# Patient Record
Sex: Male | Born: 1960 | Race: Black or African American | Hispanic: No | Marital: Single | Smoking: Current every day smoker
Health system: Southern US, Community
[De-identification: ages and names within clinical notes are randomized; demographics above are authoritative.]

## PROBLEM LIST (undated history)

## (undated) HISTORY — PX: ORTHOPEDIC SURGERY: SHX850

---

## 2010-06-11 ENCOUNTER — Emergency Department (HOSPITAL_COMMUNITY): Admission: EM | Admit: 2010-06-11 | Discharge: 2010-06-11 | Payer: Self-pay | Admitting: Emergency Medicine

## 2010-11-07 ENCOUNTER — Emergency Department (HOSPITAL_COMMUNITY)
Admission: EM | Admit: 2010-11-07 | Discharge: 2010-11-07 | Payer: Self-pay | Source: Home / Self Care | Admitting: Emergency Medicine

## 2011-01-17 LAB — URINALYSIS, ROUTINE W REFLEX MICROSCOPIC
Bilirubin Urine: NEGATIVE
Glucose, UA: NEGATIVE mg/dL
Ketones, ur: NEGATIVE mg/dL
Nitrite: NEGATIVE
Specific Gravity, Urine: 1.004 — ABNORMAL LOW (ref 1.005–1.030)
Urobilinogen, UA: 0.2 mg/dL (ref 0.0–1.0)

## 2011-01-17 LAB — ETHANOL: Alcohol, Ethyl (B): 248 mg/dL — ABNORMAL HIGH (ref 0–10)

## 2011-01-17 LAB — DIFFERENTIAL
Lymphs Abs: 2.1 10*3/uL (ref 0.7–4.0)
Monocytes Relative: 9 % (ref 3–12)
Neutro Abs: 3.4 10*3/uL (ref 1.7–7.7)

## 2011-01-17 LAB — CBC
HCT: 42.1 % (ref 39.0–52.0)
Hemoglobin: 14.1 g/dL (ref 13.0–17.0)
RBC: 4.93 MIL/uL (ref 4.22–5.81)
RDW: 13.4 % (ref 11.5–15.5)
WBC: 6.3 10*3/uL (ref 4.0–10.5)

## 2011-01-17 LAB — COMPREHENSIVE METABOLIC PANEL
ALT: 21 U/L (ref 0–53)
Albumin: 4.3 g/dL (ref 3.5–5.2)
Alkaline Phosphatase: 55 U/L (ref 39–117)
BUN: 6 mg/dL (ref 6–23)
CO2: 26 mEq/L (ref 19–32)
Calcium: 8.9 mg/dL (ref 8.4–10.5)
Chloride: 106 mEq/L (ref 96–112)
Creatinine, Ser: 0.88 mg/dL (ref 0.4–1.5)
GFR calc Af Amer: >60 mL/min (ref >60)
Total Protein: 7.4 g/dL (ref 6.0–8.3)

## 2011-01-17 LAB — RAPID URINE DRUG SCREEN, HOSP PERFORMED
Amphetamines: NOT DETECTED
Cocaine: NOT DETECTED

## 2013-10-22 ENCOUNTER — Emergency Department (HOSPITAL_COMMUNITY): Payer: Self-pay

## 2013-10-22 ENCOUNTER — Observation Stay (HOSPITAL_COMMUNITY)
Admission: EM | Admit: 2013-10-22 | Discharge: 2013-10-24 | Disposition: A | Discharge status: Home / Self Care | Payer: Self-pay | Attending: Internal Medicine | Admitting: Internal Medicine

## 2013-10-22 ENCOUNTER — Encounter (HOSPITAL_COMMUNITY): Payer: Self-pay | Admitting: Emergency Medicine

## 2013-10-22 DIAGNOSIS — M545 Low back pain, unspecified: Secondary | ICD-10-CM | POA: Diagnosis present

## 2013-10-22 DIAGNOSIS — I309 Acute pericarditis, unspecified: Principal | ICD-10-CM | POA: Diagnosis present

## 2013-10-22 DIAGNOSIS — F17210 Nicotine dependence, cigarettes, uncomplicated: Secondary | ICD-10-CM | POA: Diagnosis present

## 2013-10-22 DIAGNOSIS — R079 Chest pain, unspecified: Secondary | ICD-10-CM | POA: Diagnosis present

## 2013-10-22 DIAGNOSIS — F1010 Alcohol abuse, uncomplicated: Secondary | ICD-10-CM | POA: Diagnosis present

## 2013-10-22 DIAGNOSIS — Z23 Encounter for immunization: Secondary | ICD-10-CM | POA: Insufficient documentation

## 2013-10-22 DIAGNOSIS — F17200 Nicotine dependence, unspecified, uncomplicated: Secondary | ICD-10-CM | POA: Insufficient documentation

## 2013-10-22 LAB — COMPREHENSIVE METABOLIC PANEL
ALT: 15 U/L (ref 0–53)
Alkaline Phosphatase: 72 U/L (ref 39–117)
CO2: 29 mEq/L (ref 19–32)
Calcium: 9.1 mg/dL (ref 8.4–10.5)
GFR calc Af Amer: >90 mL/min (ref >90)
GFR calc non Af Amer: >90 mL/min (ref >90)
Potassium: 3.5 mEq/L (ref 3.5–5.1)
Sodium: 138 mEq/L (ref 135–145)

## 2013-10-22 LAB — URINALYSIS, ROUTINE W REFLEX MICROSCOPIC
Bilirubin Urine: NEGATIVE
Glucose, UA: NEGATIVE mg/dL
Hgb urine dipstick: NEGATIVE
Nitrite: NEGATIVE
Urobilinogen, UA: 1 mg/dL (ref 0.0–1.0)
pH: 7.5 (ref 5.0–8.0)

## 2013-10-22 LAB — CBC WITH DIFFERENTIAL/PLATELET
Basophils Absolute: 0 10*3/uL (ref 0.0–0.1)
HCT: 38.9 % — ABNORMAL LOW (ref 39.0–52.0)
Hemoglobin: 13.5 g/dL (ref 13.0–17.0)
MCV: 82.4 fL (ref 78.0–100.0)
Monocytes Relative: 13 % — ABNORMAL HIGH (ref 3–12)

## 2013-10-22 LAB — PROTIME-INR
INR: 1.03 (ref 0.00–1.49)
Prothrombin Time: 13.3 seconds (ref 11.6–15.2)

## 2013-10-22 LAB — APTT: aPTT: 43 seconds — ABNORMAL HIGH (ref 24–37)

## 2013-10-22 LAB — TROPONIN I: Troponin I: <0.3 ng/mL (ref <0.30)

## 2013-10-22 LAB — D-DIMER, QUANTITATIVE (NOT AT ARMC): D-Dimer, Quant: 0.33 ug/mL-FEU (ref 0.00–0.48)

## 2013-10-22 LAB — LIPASE, BLOOD: Lipase: 18 U/L (ref 11–59)

## 2013-10-22 MED ORDER — PNEUMOCOCCAL VAC POLYVALENT 25 MCG/0.5ML IJ INJ
0.5000 mL | INJECTION | INTRAMUSCULAR | Status: AC
  Administered 2013-10-24: 12:00:00 0.5 mL via INTRAMUSCULAR
  Filled 2013-10-22 (×2): qty 0.5

## 2013-10-22 MED ORDER — KETOROLAC TROMETHAMINE 30 MG/ML IJ SOLN
30.0000 mg | Freq: Once | INTRAMUSCULAR | Status: AC
  Administered 2013-10-22: 30 mg via INTRAVENOUS
  Filled 2013-10-22: qty 1

## 2013-10-22 MED ORDER — ENOXAPARIN SODIUM 40 MG/0.4ML ~~LOC~~ SOLN
40.0000 mg | SUBCUTANEOUS | Status: DC
  Administered 2013-10-22 – 2013-10-23 (×2): 40 mg via SUBCUTANEOUS
  Filled 2013-10-22 (×3): qty 0.4

## 2013-10-22 MED ORDER — LORAZEPAM 2 MG/ML IJ SOLN: 1.0000 mg | Freq: Four times a day (QID) | INTRAMUSCULAR | Status: DC | PRN

## 2013-10-22 MED ORDER — MORPHINE SULFATE 4 MG/ML IJ SOLN
4.0000 mg | Freq: Once | INTRAMUSCULAR | Status: AC
  Administered 2013-10-22: 4 mg via INTRAVENOUS
  Filled 2013-10-22: qty 1

## 2013-10-22 MED ORDER — ASPIRIN 81 MG PO CHEW
324.0000 mg | CHEWABLE_TABLET | Freq: Once | ORAL | Status: AC
  Administered 2013-10-22: 324 mg via ORAL
  Filled 2013-10-22: qty 4

## 2013-10-22 MED ORDER — INFLUENZA VAC SPLIT QUAD 0.5 ML IM SUSP
0.5000 mL | INTRAMUSCULAR | Status: AC
  Administered 2013-10-24: 12:00:00 0.5 mL via INTRAMUSCULAR
  Filled 2013-10-22 (×2): qty 0.5

## 2013-10-22 MED ORDER — GI COCKTAIL ~~LOC~~
30.0000 mL | Freq: Once | ORAL | Status: AC
  Administered 2013-10-22: 30 mL via ORAL
  Filled 2013-10-22: qty 30

## 2013-10-22 MED ORDER — THIAMINE HCL 100 MG/ML IJ SOLN
100.0000 mg | Freq: Every day | INTRAMUSCULAR | Status: DC
  Administered 2013-10-23: 100 mg via INTRAVENOUS
  Filled 2013-10-22 (×2): qty 1

## 2013-10-22 MED ORDER — NITROGLYCERIN IN D5W 200-5 MCG/ML-% IV SOLN
5.0000 ug/min | INTRAVENOUS | Status: DC
  Administered 2013-10-22: 5 ug/min via INTRAVENOUS
  Filled 2013-10-22: qty 250

## 2013-10-22 MED ORDER — SODIUM CHLORIDE 0.9 % IV BOLUS (SEPSIS)
1000.0000 mL | Freq: Once | INTRAVENOUS | Status: AC
  Administered 2013-10-22: 1000 mL via INTRAVENOUS

## 2013-10-22 MED ORDER — FOLIC ACID 1 MG PO TABS
1.0000 mg | ORAL_TABLET | Freq: Every day | ORAL | Status: DC
  Administered 2013-10-22 – 2013-10-24 (×3): 1 mg via ORAL
  Filled 2013-10-22 (×3): qty 1

## 2013-10-22 MED ORDER — HEPARIN BOLUS VIA INFUSION
4000.0000 [IU] | Freq: Once | INTRAVENOUS | Status: AC
  Administered 2013-10-22: 4000 [IU] via INTRAVENOUS
  Filled 2013-10-22: qty 4000

## 2013-10-22 MED ORDER — ONDANSETRON HCL 4 MG/2ML IJ SOLN
4.0000 mg | Freq: Once | INTRAMUSCULAR | Status: AC
  Administered 2013-10-22: 4 mg via INTRAVENOUS
  Filled 2013-10-22: qty 2

## 2013-10-22 MED ORDER — LORAZEPAM 1 MG PO TABS
1.0000 mg | ORAL_TABLET | Freq: Four times a day (QID) | ORAL | Status: DC | PRN
  Administered 2013-10-24: 1 mg via ORAL
  Filled 2013-10-22: qty 1

## 2013-10-22 MED ORDER — ADULT MULTIVITAMIN W/MINERALS CH
1.0000 | ORAL_TABLET | Freq: Every day | ORAL | Status: DC
  Administered 2013-10-22 – 2013-10-24 (×3): 1 via ORAL
  Filled 2013-10-22 (×3): qty 1

## 2013-10-22 MED ORDER — HEPARIN (PORCINE) IN NACL 100-0.45 UNIT/ML-% IJ SOLN
850.0000 [IU]/h | INTRAMUSCULAR | Status: DC
  Administered 2013-10-22: 850 [IU]/h via INTRAVENOUS
  Filled 2013-10-22: qty 250

## 2013-10-22 MED ORDER — VITAMIN B-1 100 MG PO TABS
100.0000 mg | ORAL_TABLET | Freq: Every day | ORAL | Status: DC
  Administered 2013-10-22 – 2013-10-24 (×2): 100 mg via ORAL
  Filled 2013-10-22 (×3): qty 1

## 2013-10-22 MED ORDER — NITROGLYCERIN 0.4 MG SL SUBL
0.4000 mg | SUBLINGUAL_TABLET | SUBLINGUAL | Status: AC | PRN
  Administered 2013-10-22 (×3): 0.4 mg via SUBLINGUAL

## 2013-10-22 MED ORDER — HYDROMORPHONE HCL PF 1 MG/ML IJ SOLN
1.0000 mg | Freq: Once | INTRAMUSCULAR | Status: AC
  Administered 2013-10-22: 1 mg via INTRAVENOUS
  Filled 2013-10-22: qty 1

## 2013-10-22 MED ORDER — KETOROLAC TROMETHAMINE 15 MG/ML IJ SOLN
15.0000 mg | Freq: Three times a day (TID) | INTRAMUSCULAR | Status: DC | PRN
  Filled 2013-10-22: qty 1

## 2013-10-22 NOTE — ED Provider Notes (Signed)
CSN: [id number]     Arrival date & time 10/22/13  1140 History   First MD Initiated Contact with Patient 10/22/13 1154     Chief Complaint  Patient presents with  . Chest Pain   (Consider location/radiation/quality/duration/timing/severity/associated sxs/prior Treatment) Patient is a 52 y.o. male presenting with chest pain.  Chest Pain  Pt with no significant PMH but reports he does smoke cigarettes and drinks alcohol frequently presents with complaints of epigastric and mid chest pain onset around 9am this morning, worsen since that time worse with deep breath but not associated with vomiting, SOB, cough, fever. Pain does not radiate into back. Had similar episode 15 years ago, told it was reflux.   History reviewed. No pertinent past medical history. History reviewed. No pertinent past surgical history. No family history on file. History  Substance Use Topics  . Smoking status: Current Every Day Smoker  . Smokeless tobacco: Not on file  . Alcohol Use: Yes    Review of Systems  Cardiovascular: Positive for chest pain.   All other systems reviewed and are negative except as noted in HPI.   Allergies  Review of patient's allergies indicates no known allergies.  Home Medications   Current Outpatient Rx  Name  Route  Sig  Dispense  Refill  . ibuprofen (ADVIL,MOTRIN) 200 MG tablet   Oral   Take 400 mg by mouth every 6 (six) hours as needed for moderate pain.          BP 168/95  Pulse 81  Temp(Src) 98.2 F (36.8 C)  Resp 24  SpO2 100% Physical Exam  Nursing note and vitals reviewed. Constitutional: He is oriented to person, place, and time. He appears well-developed and well-nourished.  HENT:  Head: Normocephalic and atraumatic.  Eyes: EOM are normal. Pupils are equal, round, and reactive to light.  Neck: Normal range of motion. Neck supple.  Cardiovascular: Normal rate, normal heart sounds and intact distal pulses.   Pulmonary/Chest: Effort normal and breath  sounds normal. He exhibits tenderness (sternum).  Abdominal: Bowel sounds are normal. He exhibits no distension. There is tenderness (epigastric). There is guarding. There is no rebound.  Musculoskeletal: Normal range of motion. He exhibits no edema and no tenderness.  Neurological: He is alert and oriented to person, place, and time. He has normal strength. No cranial nerve deficit or sensory deficit.  Skin: Skin is warm and dry. No rash noted.  Psychiatric: He has a normal mood and affect.    ED Course  Procedures (including critical care time) Labs Review Labs Reviewed  CBC WITH DIFFERENTIAL - Abnormal; Notable for the following:    HCT 38.9 (*)    Monocytes Relative 13 (*)    All other components within normal limits  COMPREHENSIVE METABOLIC PANEL  LIPASE, BLOOD  URINALYSIS, ROUTINE W REFLEX MICROSCOPIC  TROPONIN I  D-DIMER, QUANTITATIVE  HEPARIN LEVEL (UNFRACTIONATED)  ETHANOL  HEPARIN LEVEL (UNFRACTIONATED)  CBC  TROPONIN I  TROPONIN I  TROPONIN I  URINE RAPID DRUG SCREEN (HOSP PERFORMED)   Imaging Review Dg Chest 2 View  10/22/2013   CLINICAL DATA:  One day history of chest pain. , history of tobacco use  EXAM: CHEST  2 VIEW  COMPARISON:  None.  FINDINGS: The lungs are adequately inflated. There are coarse lung markings in the retrocardiac region on the lateral film. These may lie in the medial aspect of the left lower lobe. The cardiopericardial silhouette is normal in size. The pulmonary vascularity is not engorged. The  mediastinum is normal in width. There is mild tortuosity of the descending thoracic aorta. The observed portions of the bony thorax appear normal.  IMPRESSION: Coarse lung markings in the retrocardiac region on the lateral film may reflect atelectasis or pneumonia in the left lower lobe posteriorly. Followup films following therapy are recommended to assure clearing.   Electronically Signed   By: David  [place]   On: 10/22/2013 12:54    EKG Interpretation     Date/Time:  Saturday October 22 2013 11:45:47 EST Ventricular Rate:  78 PR Interval:  154 QRS Duration: 78 QT Interval:  376 QTC Calculation: 428 R Axis:   -16 Text Interpretation:  Normal sinus rhythm Normal ECG No significant change since last tracing Confirmed by SHELDON  MD, CHARLES ([id number]) on 10/22/2013 11:56:02 AM            MDM   1. Chest pain     Pt with no relief in pain from Morphine/Dilaudid, Given NTG with immediate relief but then pain returned, so started on NTG drip. No change in 10/10 pain on drip so then given Toradol with immediate relief. Discussed with [facility] resident who will come evaluate the patient for admission.     Charles B. Bernette Mayers, MD 10/22/13 1745

## 2013-10-22 NOTE — ED Notes (Signed)
Dr. Bernette Mayers made aware that patient states he is now pain free. Dr. Bernette Mayers states to reduce Nitro to at this time.

## 2013-10-22 NOTE — ED Notes (Signed)
Pt. Stated, i started having CP this morning around 0900, it tightens up and releases.

## 2013-10-22 NOTE — ED Notes (Signed)
Dr. Bernette Mayers made aware that Nitro Drip has not decreased pt's pain, states to maintain nitro at at this time.

## 2013-10-22 NOTE — ED Notes (Signed)
Attempted report 

## 2013-10-22 NOTE — ED Notes (Signed)
Dr. Bernette Mayers at bedside with this RN for assessment
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
## 2013-10-22 NOTE — Progress Notes (Signed)
ANTICOAGULATION CONSULT NOTE - Initial Consult  Pharmacy Consult for Heparin Indication: chest pain/ACS  No Known Allergies  Patient Measurements: Height: 5\' 10"  (177.8 cm) Weight: 150 lb (68.04 kg) IBW/kg (Calculated) : 73 Heparin Dosing Weight: 68 kg  Vital Signs: Temp: 98.2 F (36.8 C) (12/20 1145) BP: 114/74 mmHg (12/20 1500) Pulse Rate: 92 (12/20 1500)  Labs:  Recent Labs  10/22/13 1208  HGB 13.5  HCT 38.9*  PLT 232  CREATININE 0.88  TROPONINI <0.30    Estimated Creatinine Clearance: 93.4 ml/min (by C-G formula based on Cr of 0.88).   Medical History: History reviewed. No pertinent past medical history.  Medications:  PTA Med: Ibuprofen prn  Assessment: 52 y.o. male presents with CP. To begin heparin for r/o ACS. CBC stable at baseline.  Goal of Therapy:  Heparin level 0.3-0.7 units/ml Monitor platelets by anticoagulation protocol: Yes   Plan:  1. Heparin IV bolus 4000 units 2. Heparin IV gtt at 850 units/hr 3. Will f/u 6 hr heparin level 4. Daily CBC, heparin level  Christoper Fabian, PharmD, BCPS Clinical pharmacist, pager [phone] 10/22/2013,3:37 PM

## 2013-10-22 NOTE — ED Notes (Signed)
The patient is unable to give urine specimen at this time. The patient has been advised to use call light for assistance. The tech reported to the RN in charge.

## 2013-10-22 NOTE — ED Notes (Signed)
Internal medicine at bedside
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
## 2013-10-23 DIAGNOSIS — I309 Acute pericarditis, unspecified: Secondary | ICD-10-CM | POA: Diagnosis present

## 2013-10-23 DIAGNOSIS — I517 Cardiomegaly: Secondary | ICD-10-CM

## 2013-10-23 LAB — HEMOGLOBIN A1C
Hgb A1c MFr Bld: 5.6 % (ref <5.7)
Mean Plasma Glucose: 114 mg/dL (ref <117)

## 2013-10-23 LAB — LIPID PANEL
Cholesterol: 173 mg/dL (ref 0–200)
HDL: 62 mg/dL (ref >39)
Total CHOL/HDL Ratio: 2.8 RATIO

## 2013-10-23 LAB — SEDIMENTATION RATE: Sed Rate: 9 mm/hr (ref 0–16)

## 2013-10-23 LAB — COMPREHENSIVE METABOLIC PANEL
AST: 19 U/L (ref 0–37)
Albumin: 3.1 g/dL — ABNORMAL LOW (ref 3.5–5.2)
BUN: 12 mg/dL (ref 6–23)
CO2: 26 mEq/L (ref 19–32)
Calcium: 8.5 mg/dL (ref 8.4–10.5)
Creatinine, Ser: 1.04 mg/dL (ref 0.50–1.35)
Sodium: 138 mEq/L (ref 135–145)
Total Bilirubin: 0.9 mg/dL (ref 0.3–1.2)
Total Protein: 6.5 g/dL (ref 6.0–8.3)

## 2013-10-23 LAB — CBC
HCT: 32.1 % — ABNORMAL LOW (ref 39.0–52.0)
MCH: 28.2 pg (ref 26.0–34.0)
MCHC: 34 g/dL (ref 30.0–36.0)
MCV: 83.2 fL (ref 78.0–100.0)
Platelets: 194 10*3/uL (ref 150–400)
RDW: 13.3 % (ref 11.5–15.5)

## 2013-10-23 LAB — TROPONIN I
Troponin I: <0.3 ng/mL (ref <0.30)
Troponin I: <0.3 ng/mL (ref <0.30)

## 2013-10-23 LAB — TSH: TSH: 0.858 u[IU]/mL (ref 0.350–4.500)

## 2013-10-23 LAB — C-REACTIVE PROTEIN: CRP: 3.9 mg/dL — ABNORMAL HIGH (ref <0.60)

## 2013-10-23 MED ORDER — COLCHICINE 0.6 MG PO TABS
0.6000 mg | ORAL_TABLET | Freq: Every day | ORAL | Status: DC
  Administered 2013-10-23 – 2013-10-24 (×2): 0.6 mg via ORAL
  Filled 2013-10-23 (×2): qty 1

## 2013-10-23 MED ORDER — IBUPROFEN 600 MG PO TABS: 600.0000 mg | ORAL_TABLET | Freq: Three times a day (TID) | ORAL | Status: DC

## 2013-10-23 MED ORDER — IBUPROFEN 600 MG PO TABS
600.0000 mg | ORAL_TABLET | Freq: Three times a day (TID) | ORAL | Status: DC
  Administered 2013-10-23 – 2013-10-24 (×5): 600 mg via ORAL
  Filled 2013-10-23 (×6): qty 1

## 2013-10-23 MED ORDER — SENNOSIDES-DOCUSATE SODIUM 8.6-50 MG PO TABS
1.0000 | ORAL_TABLET | Freq: Two times a day (BID) | ORAL | Status: DC
  Administered 2013-10-23 – 2013-10-24 (×2): 1 via ORAL
  Filled 2013-10-23 (×2): qty 1

## 2013-10-23 MED ORDER — NICOTINE 14 MG/24HR TD PT24
14.0000 mg | MEDICATED_PATCH | Freq: Every day | TRANSDERMAL | Status: DC
  Administered 2013-10-23 – 2013-10-24 (×2): 14 mg via TRANSDERMAL
  Filled 2013-10-23 (×2): qty 1

## 2013-10-23 MED ORDER — COLCHICINE 0.6 MG PO TABS: 0.6000 mg | ORAL_TABLET | Freq: Every day | ORAL | Status: DC

## 2013-10-23 MED ORDER — ACETAMINOPHEN 325 MG PO TABS
650.0000 mg | ORAL_TABLET | ORAL | Status: DC | PRN
  Administered 2013-10-23: 650 mg via ORAL
  Filled 2013-10-23: qty 2

## 2013-10-23 NOTE — Progress Notes (Signed)
Received report from [place], RN; assumed care of patient.  Patient arrived from ED via stretcher and is alert and oriented x4.  NAD noted.  Report chest pain 3/10 when he "takes a deep breath".  Vitals obtained and skin assessment completed.  No areas of pressure noted.  Physical assessment completed. (see doc flow sheet).  Faint pericardial rub noted on physical assessment.  ST elevation noted on monitor.  Does not appear to be different from what is presented on EKG.  Spouse at the bedside.  Will continue to monitor

## 2013-10-23 NOTE — Progress Notes (Signed)
  Echocardiogram 2D Echocardiogram has been performed.  Cathie Beams 10/23/2013, 12:31 PM

## 2013-10-23 NOTE — Progress Notes (Signed)
Utilization Review Completed.Coby Antrobus T12/21/2014  

## 2013-10-23 NOTE — Progress Notes (Signed)
Shift Summary:  Patient has had no acute events this shift.  Patient was febrile but managed with meds per order.  Marland Kitchen

## 2013-10-23 NOTE — Progress Notes (Signed)
Subjective: Complaints of chest pain when he breathes deeply and leans forward. No nausea, vomiting, dizziness or SOB. Wife at bedside.  Objective: Vital signs in last 24 hours: Filed Vitals:   10/23/13 0336 10/23/13 0400 10/23/13 0451 10/23/13 0740  BP:  144/73  128/83  Pulse:  72  71  Temp: 99.7 F (37.6 C)   98.8 F (37.1 C)  TempSrc: Oral   Oral  Resp:      Height:      Weight:   143 lb 15.4 oz (65.3 kg)   SpO2:  99%  99%   Weight change:   Intake/Output Summary (Last 24 hours) at 10/23/13 1039 Last data filed at 10/23/13 1610  Gross per 24 hour  Intake   1360 ml  Output    750 ml  Net    610 ml   General appearance: alert, cooperative, appears stated age and no distress Lungs: clear to auscultation bilaterally Heart: regular rate and rhythm, S1, S2 normal, Rubs present heard over anterior chest in cardiac region, heard best on leaning forward, no murmurs or gallops. Abdomen: soft, non-tender; bowel sounds normal; no masses,  no organomegaly Extremities: extremities normal, atraumatic, no cyanosis or edema Pulses: 2+ and symmetric  Lab Results: Basic Metabolic Panel:  Recent Labs Lab 10/22/13 1208 10/23/13 0500  NA 138 138  K 3.5 4.0  CL 98 103  CO2 29 26  GLUCOSE 81 91  BUN 11 12  CREATININE 0.88 1.04  CALCIUM 9.1 8.5   Liver Function Tests:  Recent Labs Lab 10/22/13 1208 10/23/13 0500  AST 26 19  ALT 15 11  ALKPHOS 72 58  BILITOT 0.7 0.9  PROT 8.0 6.5  ALBUMIN 4.2 3.1*    Recent Labs Lab 10/22/13 1208  LIPASE 18   No results found for this basename: AMMONIA,  in the last 168 hours CBC:  Recent Labs Lab 10/22/13 1208 10/23/13 0500  WBC 6.6 6.9  NEUTROABS 4.3  --   HGB 13.5 10.9*  HCT 38.9* 32.1*  MCV 82.4 83.2  PLT 232 194   Cardiac Enzymes:  Recent Labs Lab 10/22/13 1648 10/22/13 2320 10/23/13 0530  TROPONINI <0.30 <0.30 <0.30   BNP: No results found for this basename: PROBNP,  in the last 168  hours D-Dimer:  Recent Labs Lab 10/22/13 1648  DDIMER 0.33   Fasting Lipid Panel:  Recent Labs Lab 10/23/13 0500  CHOL 173  HDL 62  LDLCALC 95  TRIG 80  CHOLHDL 2.8   Coagulation:  Recent Labs Lab 10/22/13 2320  LABPROT 13.3  INR 1.03   Urine Drug Screen: Drugs of Abuse     Component Value Date/Time   LABOPIA NONE DETECTED 06/11/2010 0119   COCAINSCRNUR NONE DETECTED 06/11/2010 0119   LABBENZ NONE DETECTED 06/11/2010 0119   AMPHETMU NONE DETECTED 06/11/2010 0119   THCU NONE DETECTED 06/11/2010 0119   LABBARB  Value: NONE DETECTED        DRUG SCREEN FOR MEDICAL PURPOSES ONLY.  IF CONFIRMATION IS NEEDED FOR ANY PURPOSE, NOTIFY LAB WITHIN 5 DAYS.        LOWEST DETECTABLE LIMITS FOR URINE DRUG SCREEN Drug Class       Cutoff (ng/mL) Amphetamine      1000 Barbiturate      200 Benzodiazepine   200 Tricyclics       300 Opiates          300 Cocaine          300 THC  50 06/11/2010 0119    Alcohol Level:  Recent Labs Lab 10/22/13 1648  ETH <11   Urinalysis:  Recent Labs Lab 10/22/13 1649  COLORURINE YELLOW  LABSPEC 1.020  PHURINE 7.5  GLUCOSEU NEGATIVE  HGBUR NEGATIVE  BILIRUBINUR NEGATIVE  KETONESUR NEGATIVE  PROTEINUR NEGATIVE  UROBILINOGEN 1.0  NITRITE NEGATIVE  LEUKOCYTESUR NEGATIVE    Micro Results: Recent Results (from the past 240 hour(s))  MRSA PCR SCREENING     Status: None   Collection Time    10/22/13  7:53 PM      Result Value Range Status   MRSA by PCR NEGATIVE  NEGATIVE Final   Comment:            The GeneXpert MRSA Assay (FDA     approved for NASAL specimens     only), is one component of a     comprehensive MRSA colonization     surveillance program. It is not     intended to diagnose MRSA     infection nor to guide or     monitor treatment for     MRSA infections.   Studies/Results: Dg Chest 2 View  10/22/2013   CLINICAL DATA:  One day history of chest pain. , history of tobacco use  EXAM: CHEST  2 VIEW  COMPARISON:  None.   FINDINGS: The lungs are adequately inflated. There are coarse lung markings in the retrocardiac region on the lateral film. These may lie in the medial aspect of the left lower lobe. The cardiopericardial silhouette is normal in size. The pulmonary vascularity is not engorged. The mediastinum is normal in width. There is mild tortuosity of the descending thoracic aorta. The observed portions of the bony thorax appear normal.  IMPRESSION: Coarse lung markings in the retrocardiac region on the lateral film may reflect atelectasis or pneumonia in the left lower lobe posteriorly. Followup films following therapy are recommended to assure clearing.   Electronically Signed   By: David  [place]   On: 10/22/2013 12:54   Medications: I have reviewed the patient's current medications. Scheduled Meds: . colchicine  0.6 mg Oral Daily  . enoxaparin (LOVENOX) injection  40 mg Subcutaneous Q24H  . folic acid  1 mg Oral Daily  . ibuprofen  600 mg Oral TID  . influenza vac split quadrivalent PF  0.5 mL Intramuscular Tomorrow-1000  . multivitamin with minerals  1 tablet Oral Daily  . pneumococcal 23 valent vaccine  0.5 mL Intramuscular Tomorrow-1000  . thiamine  100 mg Oral Daily   Or  . thiamine  100 mg Intravenous Daily   Continuous Infusions:  PRN Meds:.LORazepam, LORazepam  Assessment/Plan:  # Acute Pericarditis-  Supported by Chest pain, relieved by sitting up, worse on lying back, examination findings today- +for Rubs, EKG findings this morning mores suggestive of pericarditis- with diffuse ST elev in multiple leads, and totally relievd by Toradol given in the ED. Cardiac ischemia , cardiac tamponade, pneumothorax, PE all ruled out.  - ESR, and a d-dimer, UDS were normal - 2-D echocardiogram- to rule out- Pericardial effusion. - Lipid panel- LDL- 95, Total- 173, TG- 80, HDL- 62, HBA1c-pending. - Start Ibuprofen 600mg  TID, Colchicine- 0.6 daily. - Consider consultation with cardiology - Possible  discharge pending Echo    # Alcohol excessive use: Patient admits to taking his 6 cans of 12 ounces of beer on a daily basis.  - CIWA protocols  - Encourage pt to reduce alcohol intake.  # Cigarette smoking: Nicotine patch.  Dispo: Disposition is deferred at this time, awaiting improvement of current medical problems.  Anticipated discharge in approximately today or tomorrow.  The patient does not have a current PCP (No Pcp Per Patient) and does need an [facility] hospital follow-up appointment after discharge.  The patient does not know have transportation limitations that hinder transportation to clinic appointments.  .Services Needed at time of discharge: Y = Yes, Blank = No PT:   OT:   RN:   Equipment:   Other:     LOS: 1 day   Kennis Carina, MD 10/23/2013, 10:39 AM

## 2013-10-23 NOTE — H&P (Signed)
  Date: 10/23/2013  Patient name: David Morrison  Medical record number: [id number]  Date of birth: 1961/01/18   I have seen and evaluated Selinda Eon and discussed their care with the Residency Team. Mr. Burkett is a 52yo man with limited PMH, cigarette smoking who presented for chest pain which started morning of admission and woke him up.  He described it as sharp and squeezing, non radiating, worse with sitting up and taking a deep breath.  He was treated with nitroglycerin, dilaudid, asa, GI cocktail in the ED with no relief.  He had, however, almost immediate relief with IV toradol.  EKG showed some possible ST elevated in the lateral leads and very mild PR interval depression in I, II and III.  CXR and labs were relatively unremarkable (possible atelectasis in LLL on CXR).    Assessment and Plan: I have seen and evaluated the patient as outlined above. I agree with the formulated Assessment and Plan as detailed in the residents' admission note, with the following changes:   1. Chest pain, likely pericarditis given history: Given h/o smoking and limited medical contact, will rule out for ACS with cardiac enzymes.  TIMI and Well's are 0.  Ddimer is negative.  Check ESR, CRP, TTE.  Agree with PRN IV toradol.  Recheck EKG, if more suggestive of pericarditis, put on scheduled NSAIDs and colchicine.    Other issues per resident note.    Inez Catalina, MD 12/21/201412:13 PM

## 2013-10-23 NOTE — Progress Notes (Signed)
Patient transferred to 5W 26 from 2H 27. Alert and oriented, independent, oriented to room and unit, placed on telemetry box #19, skin intact, no c/o pain. David Morrison

## 2013-10-24 LAB — CBC
Hemoglobin: 11.6 g/dL — ABNORMAL LOW (ref 13.0–17.0)
MCH: 28.1 pg (ref 26.0–34.0)
MCHC: 33.7 g/dL (ref 30.0–36.0)
RDW: 13 % (ref 11.5–15.5)
WBC: 5 10*3/uL (ref 4.0–10.5)

## 2013-10-24 MED ORDER — OMEPRAZOLE 20 MG PO TBEC: 40.0000 mg | DELAYED_RELEASE_TABLET | Freq: Every day | ORAL | Status: DC

## 2013-10-24 MED ORDER — COLCHICINE 0.6 MG PO TABS: 0.6000 mg | ORAL_TABLET | Freq: Every day | ORAL | Status: DC

## 2013-10-24 MED ORDER — IBUPROFEN 600 MG PO TABS: 600.0000 mg | ORAL_TABLET | Freq: Three times a day (TID) | ORAL | Status: DC

## 2013-10-24 NOTE — Progress Notes (Addendum)
Subjective: No complaints today. Feels fine. Ready to go home. No chest pain since yesterday, when NSAIDs were scheduled. No difficulty breathing.  Objective: Vital signs in last 24 hours: Filed Vitals:   10/23/13 1602 10/23/13 1742 10/23/13 2358 10/24/13 0532  BP: 136/89 131/89 130/84 145/85  Pulse:  80 68 64  Temp:  97.7 F (36.5 C) 98.2 F (36.8 C) 97.7 F (36.5 C)  TempSrc:  Oral Oral Oral  Resp:  18 18 16   Height:  5\' 10"  (1.778 m)    Weight:  145 lb 14.4 oz (66.18 kg)  145 lb 14.4 oz (66.18 kg)  SpO2: 100% 98% 100% 100%   Weight change: -4 lb 1.6 oz (-1.86 kg)  Intake/Output Summary (Last 24 hours) at 10/24/13 1304 Last data filed at 10/24/13 1009  Gross per 24 hour  Intake    720 ml  Output      0 ml  Net    720 ml   General appearance: Slim appearing middle aged male, lying in bed comfortably. Lungs: clear to auscultation bilaterally Heart: regular rate and rhythm, S1, S2 normal, Rubs present heard over anterior chest in cardiac region, heard best on leaning forward, no murmurs or gallops. Abdomen: soft, non-tender; bowel sounds normal; no masses,  no organomegaly Extremities: extremities normal, atraumatic, no cyanosis or edema Pulses: No pedal edema. 2+ and symmetric.  Lab Results: Basic Metabolic Panel:  Recent Labs Lab 10/22/13 1208 10/23/13 0500  NA 138 138  K 3.5 4.0  CL 98 103  CO2 29 26  GLUCOSE 81 91  BUN 11 12  CREATININE 0.88 1.04  CALCIUM 9.1 8.5   Liver Function Tests:  Recent Labs Lab 10/22/13 1208 10/23/13 0500  AST 26 19  ALT 15 11  ALKPHOS 72 58  BILITOT 0.7 0.9  PROT 8.0 6.5  ALBUMIN 4.2 3.1*    Recent Labs Lab 10/22/13 1208  LIPASE 18   CBC:  Recent Labs Lab 10/22/13 1208 10/23/13 0500 10/24/13 0615  WBC 6.6 6.9 5.0  NEUTROABS 4.3  --   --   HGB 13.5 10.9* 11.6*  HCT 38.9* 32.1* 34.4*  MCV 82.4 83.2 83.3  PLT 232 194 219   Cardiac Enzymes:  Recent Labs Lab 10/22/13 1648 10/22/13 2320 10/23/13 0530   TROPONINI <0.30 <0.30 <0.30   D-Dimer:  Recent Labs Lab 10/22/13 1648  DDIMER 0.33   Fasting Lipid Panel:  Recent Labs Lab 10/23/13 0500  CHOL 173  HDL 62  LDLCALC 95  TRIG 80  CHOLHDL 2.8   Coagulation:  Recent Labs Lab 10/22/13 2320  LABPROT 13.3  INR 1.03   Urine Drug Screen: Drugs of Abuse     Component Value Date/Time   LABOPIA NONE DETECTED 06/11/2010 0119   COCAINSCRNUR NONE DETECTED 06/11/2010 0119   LABBENZ NONE DETECTED 06/11/2010 0119   AMPHETMU NONE DETECTED 06/11/2010 0119   THCU NONE DETECTED 06/11/2010 0119   LABBARB  Value: NONE DETECTED        DRUG SCREEN FOR MEDICAL PURPOSES ONLY.  IF CONFIRMATION IS NEEDED FOR ANY PURPOSE, NOTIFY LAB WITHIN 5 DAYS.        LOWEST DETECTABLE LIMITS FOR URINE DRUG SCREEN Drug Class       Cutoff (ng/mL) Amphetamine      1000 Barbiturate      200 Benzodiazepine   200 Tricyclics       300 Opiates          300 Cocaine  300 THC              50 06/11/2010 0119    Alcohol Level:  Recent Labs Lab 10/22/13 1648  ETH <11   Urinalysis:  Recent Labs Lab 10/22/13 1649  COLORURINE YELLOW  LABSPEC 1.020  PHURINE 7.5  GLUCOSEU NEGATIVE  HGBUR NEGATIVE  BILIRUBINUR NEGATIVE  KETONESUR NEGATIVE  PROTEINUR NEGATIVE  UROBILINOGEN 1.0  NITRITE NEGATIVE  LEUKOCYTESUR NEGATIVE    Micro Results: Recent Results (from the past 240 hour(s))  MRSA PCR SCREENING     Status: None   Collection Time    10/22/13  7:53 PM      Result Value Range Status   MRSA by PCR NEGATIVE  NEGATIVE Final   Comment:            The GeneXpert MRSA Assay (FDA     approved for NASAL specimens     only), is one component of a     comprehensive MRSA colonization     surveillance program. It is not     intended to diagnose MRSA     infection nor to guide or     monitor treatment for     MRSA infections.   Studies/Results: No results found. Medications: I have reviewed the patient's current medications. Scheduled Meds: . colchicine  0.6 mg  Oral Daily  . enoxaparin (LOVENOX) injection  40 mg Subcutaneous Q24H  . folic acid  1 mg Oral Daily  . ibuprofen  600 mg Oral TID  . multivitamin with minerals  1 tablet Oral Daily  . nicotine  14 mg Transdermal Daily  . senna-docusate  1 tablet Oral BID  . thiamine  100 mg Oral Daily   Continuous Infusions:  PRN Meds:.LORazepam, LORazepam  Assessment/Plan:  # Acute Pericarditis-  Supported by Chest pain, relieved by sitting up, worse on lying back, examination findings today- +for Rubs, EKG findings this morning mores suggestive of pericarditis- with diffuse ST elev in multiple leads, and totally relievd by Toradol given in the ED. Cardiac ischemia , cardiac tamponade, pneumothorax, PE all ruled out.  - ESR, and a d-dimer, UDS were normal - 2-D echocardiogram-  Negative Pericardial effusion. - Lipid panel- LDL- 95, Total- 173, TG- 80, HDL- 62, HBA1c-pending. - Discharge home today on Ibuprofen 600mg  TID for 2 weeks , and Colchicine- 0.6 daily for 3 months. - Encouraged pt about strictly adhering to therapy for the duration of therapy.  - prescribed prophylatic PPI- omeprazole- 40mg  daily for 2 weeks, as pt will be on NSAIDs. - Clinic follow up copuld not be arranged within the next 2 weeks, but pt has an appointment to follow up with the community [facility] and wellness at 11am on the 7th of January.  # Alcohol excessive use: Patient admits to taking his 6 cans of 12 ounces of beer on a daily basis.  - CIWA protocols. - Encourage pt to reduce alcohol intake.  # Cigarette smoking: Nicotine patch.   Dispo: Disposition is deferred at this time, awaiting improvement of current medical problems.  Anticipated discharge in approximately today or tomorrow.  The patient does not have a current PCP (No Pcp Per Patient) and does need an [facility] hospital follow-up appointment after discharge.  The patient does not know have transportation limitations that hinder transportation to clinic  appointments.  .Services Needed at time of discharge: Y = Yes, Blank = No PT:   OT:   RN:   Equipment:   Other:  LOS: 2 days   Kennis Carina, MD 10/24/2013, 1:04 PM

## 2013-10-24 NOTE — H&P (Deleted)
Name: David Morrison MRN: [id number] DOB: Mar 11, 1961 52 y.o. PCP: No Pcp Per Patient  Date of Admission: 10/22/2013 11:49 AM Date of Discharge: 10/24/2013 Attending Physician: Inez Catalina, MD  Discharge Diagnosis: Principal Problem:   Acute pericarditis Active Problems:   Smoking 1/2 pack a day or less   Excessive drinking alcohol   Low back pain  Discharge Medications:   Medication List         colchicine 0.6 MG tablet  Take 1 tablet (0.6 mg total) by mouth daily.     ibuprofen 600 MG tablet  Commonly known as:  ADVIL,MOTRIN  Take 1 tablet (600 mg total) by mouth 3 (three) times daily.     Omeprazole 20 MG Tbec  Take 2 tablets (40 mg total) by mouth daily.        Disposition and follow-up:   David Morrison was discharged from [facility] in Good condition.  At the hospital follow up visit please address:  1.  Resolution of chest pain. Compliance with Ibuprofen 600mg  TID for 2 weeks, Colchicine- 0.6 daily for 3 months and prophylactic therapy with Omeprazole 40mg  daily for 2 weeks.    Follow-up Appointments:     Follow-up Information   Follow up with [facility] On 11/09/2013. (11 am for hospital follow up)    Contact information:   [address] [place] [address] [phone]      Follow up with [facility] On 11/15/2013. (12 for orange card eligibility, please bring completed paper work)    Contact information:   [address] [place] [address] [phone]      Discharge Instructions: Discharge Orders   Future Appointments Provider Department Dept Phone   11/09/2013 11:00 AM Gaylord Shih, MD [facility] [phone]   11/15/2013 12:00 PM [employer] [facility] [phone]   Future Orders Complete By Expires   Diet - low sodium heart healthy  As directed    Diet - low sodium heart healthy  As directed    Discharge instructions  As directed    Comments:     You have a condition called pericarditis, which is an inflammation of the lining of your heart. You will be taking Ibuprofen 600mg  three times a day for 2 weeks. And also we will also be giving you colchicine- 0.6 mg once a day which you must take for 3 months.  We will schedule an appoitment for you to see [name] in clinic just to be sure everything is okay. This appointment can not be scheduled now as it is weekend, but we will contact you with the information.   Please this is very important. Please ensure that you get these drugs as it is very important for your heart, complications can develop if this is not treated appropriately.   Discharge instructions  As directed    Comments:     We will be discharging you to complete 2 weeks of Ibuprofen, please take 600mg  three times a day. Also Colchicine- 0.6mg  once a day for 3 months.  Ibuprofen can cause stomach ulcers and irritate the lining of your stomach. So we will be prescribing an additional medication- called Omeprazole or prilosec which is over the counter. Please take 40mg  once a day for two weeks.   We were not able to schedule an appointment for you to see [name] in clinic  within the next 2 weeks, but you have an appointment for the January the 7th at 11am, with the [employer] and wellness. Please keep this appointment.   Increase activity slowly  As directed    Increase activity slowly  As directed       Consultations:    Procedures Performed:  Dg Chest 2 View  10/22/2013   CLINICAL DATA:  One day history of chest pain. , history of tobacco use  EXAM: CHEST  2 VIEW  COMPARISON:  None.  FINDINGS: The lungs are adequately inflated. There are coarse lung markings in the retrocardiac region on the lateral film. These may lie in the medial aspect of the left lower lobe. The cardiopericardial silhouette is normal in size. The pulmonary  vascularity is not engorged. The mediastinum is normal in width. There is mild tortuosity of the descending thoracic aorta. The observed portions of the bony thorax appear normal.  IMPRESSION: Coarse lung markings in the retrocardiac region on the lateral film may reflect atelectasis or pneumonia in the left lower lobe posteriorly. Followup films following therapy are recommended to assure clearing.   Electronically Signed   By: David  [place]   On: 10/22/2013 12:54    2D Echo: TEE- LV EF: 50% - 55% Pericardium: There was no pericardial effusion. Study Conclusions- Left ventricle: The cavity size was normal. There was moderate concentric hypertrophy. Systolic function was normal. The estimated ejection fraction was in the range of 50% to 55%. Wall motion was normal; there were no regional wall motion abnormalities.  Admission HPI: Chief Complaint: Chest pain  History of Present Illness: This is a 52 year old African American gentleman, with past medical history of cigarette smoking and alcohol use, who presents to the ED, with chest pain, which started in the morning of admission. He reports that when he woke up today in the morning, he noticed that his chest was hurting. He describes his chest pain as sharp and squeezing, reaching an intensity of 8/10 but had significantly improved after IV Toradol 30 mg. Initially, he ignored it and proceeded to his workplace. However, when the chest pain persisted, he drove himself to the emergency department. The chest pain is intermittent nonradiating, but it increased with breathing deeply. He denies diaphoresis, nausea, vomiting or dizziness. He also denies SOB. His chest pain is worsened by lying flat and it is somehow improved by sitting upright. However, he doesn't improve with leaning forward. He denies cough, fevers, chills, reduced appetite, wheezing. He also denies orthopnea, exertional dyspnea, or chest pain with exertion. Patient is active, and this is the  first time he has ever experienced chest pain. He has no previous cardiac evaluation. He denies any illicit drug use. Patient has not been recently on a long trip. No history of blood clots. He denies palpitations, no lower extremity edema.  His chest pain in the emergency department, was not relieved by Dilaudid 1 mg IV, aspirin, GI cocktail, or morphine 4 mg. Also sublingual nitroglycerin 0.4 mg x3, only slightly reduced his chest pain. However, 30 mg of Toradol IV significantly improved his chest pain   Hospital Course by problem list: Principal Problem:   Acute pericarditis Active Problems:   Smoking 1/2 pack a day or less   Excessive drinking alcohol   Low back pain  # Acute Pericarditis-  Pt presented with complaints of chest pain, which were pleuritic in nature, Clinical history seemed to point toward acute pericarditis as a possible cause of his chest  pain , especially given that his chest pain improved by sitting upright and was significantly relieved with Toradol and not with Dialudid, Nitroglycerin (both sublingual and IV drip), or morphine which were given in the ED,but initial EKG was not supportive, with up sloping St segment in lead 3 only.  Other etiologies that were considered, Cardiac ischemia which was ruled out, with 3 Negative troponins and EKG findings that were not supportive. Pneumothorax, aortic dissection and Pneumonia were excluded with normal vital signs and chest x-ray that was not suggestive. Low clinical index of suspicion, with no known risk factors for PE, but a Ddimer was done which was negative. Patient was admitted to Telemetry. ESR- WNL, CRP was elevated at 3.9, UDS- was negative, lipid panel- LDL- 95, TSH- WNL, HBA1c- 5.6. Pt was placed on IV toradol- Q8H.  EKG was repeated the next morning showed diffuse ST segment elevation in multiple leads, which was very suggestive of acute pericarditis, and on examination pericardial rubs were heard on chest auscultation  when  patient leaned forward. 2D echo ordered was negative for Pericardial effusion. ANA and Beta quantiferon were drawn to rule out Autoimmune dx and TB pericarditis as possible etiologies but these were both negative. Exact cause of pericarditis was therefore not known. Patient reported absence of chest pain on toradol, was therefore discharged to complete a 2 week course of Ibuprofen at 600mg  TID and Colchicine 0.6mg  daily for 3 months. And was counselled on the importance of adhering to treatment,and to follow up closely in clinic.   # Alcohol excessive use: Patient admitted to drinking 6 cans of 12 ounces of beer on a daily basis. Pt was placed on CIWA protocol on admission, but no symptoms of alcohol withdrawal developed during admission.  # Cigarette smoking: Nicotine patch.    Discharge Vitals:   BP 131/85  Pulse 78  Temp(Src) 98.5 F (36.9 C) (Oral)  Resp 17  Ht 5\' 10"  (1.778 m)  Wt 145 lb 14.4 oz (66.18 kg)  BMI 20.93 kg/m2  SpO2 100%  Discharge Labs:  Results for orders placed during the hospital encounter of 10/22/13 (from the past 24 hour(s))  CBC     Status: Abnormal   Collection Time    10/24/13  6:15 AM      Result Value Range   WBC 5.0  4.0 - 10.5 K/uL   RBC 4.13 (*) 4.22 - 5.81 MIL/uL   Hemoglobin 11.6 (*) 13.0 - 17.0 g/dL   HCT 16.1 (*) 09.6 - 04.5 %   MCV 83.3  78.0 - 100.0 fL   MCH 28.1  26.0 - 34.0 pg   MCHC 33.7  30.0 - 36.0 g/dL   RDW 40.9  81.1 - 91.4 %   Platelets 219  150 - 400 K/uL    Signed: Kennis Carina, MD 10/24/2013, 2:45 PM   Time Spent on Discharge: 35 minutes Services Ordered on Discharge: None. Equipment Ordered on Discharge: None.

## 2013-10-24 NOTE — Progress Notes (Signed)
Internal Medicine Attending  Date: 10/24/2013  Patient name: David Morrison Medical record number: [id number] Date of birth: 1961/07/13 Age: 52 y.o. Gender: male  I saw and evaluated the patient and discussed his care on a.m. rounds with house staff. I reviewed the resident's note by Dr. Mariea Clonts and I agree with the resident's findings and plans as documented in her note.  Patient was hospitalized with symptoms and EKG consistent with acute pericarditis; his pain resolved yesterday on ibuprofen and has not recurred.  He is feeling well today, without any problems.  The cause of his pericarditis is not clear; he has no symptoms to suggest an underlying systemic disease.  Agree with plan to discharge home today on ibuprofen and low dose colchicine.  An ANA and Quantiferon TB assay are pending, and should be reviewed when he returns for outpatient followup within the next 2 weeks.

## 2013-10-24 NOTE — Care Management Note (Signed)
    Page 1 of 1   10/24/2013     4:27:00 PM   CARE MANAGEMENT NOTE 10/24/2013  Patient:  David Morrison, David Morrison   Account Number:  [id number]  Date Initiated:  10/24/2013  Documentation initiated by:  Letha Cape  Subjective/Objective Assessment:   dx acute percarditis  admit- lives with spouse.     Action/Plan:   Anticipated DC Date:  10/24/2013   Anticipated DC Plan:  HOME/SELF CARE      DC Planning Services  CM consult  [facility]-up appt scheduled  [facility]      Choice offered to / List presented to:             Status of service:  Completed, signed off Medicare Important Message given?   (If response is "NO", the following Medicare IM given date fields will be blank) Date Medicare IM given:   Date Additional Medicare IM given:    Discharge Disposition:  HOME/SELF CARE  Per UR Regulation:  Reviewed for med. necessity/level of care/duration of stay  If discussed at Long Length of Stay Meetings, dates discussed:    Comments:  10/24/13 16:25 Letha Cape RN, BSN [phone] patient dc to home, no NCM referral, no needs anticipated, RN stated patient did not need any medication ast and was dc home.

## 2013-10-25 LAB — ANA: Anti Nuclear Antibody(ANA): NEGATIVE

## 2013-10-26 LAB — QUANTIFERON TB GOLD ASSAY (BLOOD)
Interferon Gamma Release Assay: NEGATIVE
Mitogen value: 8.26 IU/mL
Quantiferon Nil Value: 0.03 IU/mL

## 2013-10-27 NOTE — Discharge Summary (Signed)
Name: David Morrison MRN: [id number] DOB: 05-16-1961 52 y.o. PCP: No Pcp Per Patient  Date of Admission: 10/22/2013 11:49 AM Date of Discharge: 10/24/2013 Attending Physician: Inez Catalina, MD  Discharge Diagnosis: Principal Problem:   Acute pericarditis Active Problems:   Smoking 1/2 pack a day or less   Excessive drinking alcohol   Low back pain  Discharge Medications:   Medication List         colchicine 0.6 MG tablet  Take 1 tablet (0.6 mg total) by mouth daily.     ibuprofen 600 MG tablet  Commonly known as:  ADVIL,MOTRIN  Take 1 tablet (600 mg total) by mouth 3 (three) times daily.     Omeprazole 20 MG Tbec  Take 2 tablets (40 mg total) by mouth daily.        Disposition and follow-up:   Mr.David Morrison was discharged from [facility] in Good condition.  At the hospital follow up visit please address:  1.  Resolution of chest pain. Compliance with Ibuprofen 600mg  TID for 2 weeks, Colchicine- 0.6 daily for 3 months and prophylactic therapy with Omeprazole 40mg  daily for 2 weeks.    Follow-up Appointments:     Follow-up Information   Follow up with [facility] On 11/09/2013. (11 am for hospital follow up)    Contact information:   [address] [place] [address] [phone]      Follow up with [facility] On 11/15/2013. (12 for orange card eligibility, please bring completed paper work)    Contact information:   [address] [place] [address] [phone]      Discharge Instructions: Discharge Orders   Future Appointments Provider Department Dept Phone   11/09/2013 11:00 AM Gaylord Shih, MD [facility] [phone]   11/15/2013 12:00 PM [employer] Financial Counselor [facility] [phone]   Future Orders Complete By Expires   Diet - low sodium heart healthy  As directed    Diet - low sodium heart healthy  As directed    Discharge instructions  As directed    Comments:     You have a condition called pericarditis, which is an inflammation of the lining of your heart. You will be taking Ibuprofen 600mg  three times a day for 2 weeks. And also we will also be giving you colchicine- 0.6 mg once a day which you must take for 3 months.  We will schedule an appoitment for you to see David Morrison in clinic just to be sure everything is okay. This appointment can not be scheduled now as it is weekend, but we will contact you with the information.   Please this is very important. Please ensure that you get these drugs as it is very important for your heart, complications can develop if this is not treated appropriately.   Discharge instructions  As directed    Comments:     We will be discharging you to complete 2 weeks of Ibuprofen, please take 600mg  three times a day. Also Colchicine- 0.6mg  once a day for 3 months.  Ibuprofen can cause stomach ulcers and irritate the lining of your stomach. So we will be prescribing an additional medication- called Omeprazole or prilosec which is over the counter. Please take 40mg  once a day for two weeks.   We were not able to schedule an appointment for you to see David Morrison in clinic  within the next 2 weeks, but you have an appointment for the January the 7th at 11am, with the [employer] and [facility]. Please keep this appointment.   Increase activity slowly  As directed    Increase activity slowly  As directed       Consultations:  None  Procedures Performed:  Dg Chest 2 View  10/22/2013   CLINICAL DATA:  One day history of chest pain. , history of tobacco use  EXAM: CHEST  2 VIEW  COMPARISON:  None.  FINDINGS: The lungs are adequately inflated. There are coarse lung markings in the retrocardiac region on the lateral film. These may lie in the medial aspect of the left lower lobe. The cardiopericardial silhouette is normal in size. The  pulmonary vascularity is not engorged. The mediastinum is normal in width. There is mild tortuosity of the descending thoracic aorta. The observed portions of the bony thorax appear normal.  IMPRESSION: Coarse lung markings in the retrocardiac region on the lateral film may reflect atelectasis or pneumonia in the left lower lobe posteriorly. Followup films following therapy are recommended to assure clearing.   Electronically Signed   By: David  [place]   On: 10/22/2013 12:54    2D Echo: TEE- LV EF: 50% - 55% Pericardium: There was no pericardial effusion. Study Conclusions- Left ventricle: The cavity size was normal. There was moderate concentric hypertrophy. Systolic function was normal. The estimated ejection fraction was in the range of 50% to 55%. Wall motion was normal; there were no regional wall motion abnormalities.  Admission HPI: Chief Complaint: Chest pain   History of Present Illness: This is a 52 year old African American gentleman, with past medical history of cigarette smoking and alcohol use, who presents to the ED, with chest pain, which started in the morning of admission. He reports that when he woke up today in the morning, he noticed that his chest was hurting. He describes his chest pain as sharp and squeezing, reaching an intensity of 8/10 but had significantly improved after IV Toradol 30 mg. Initially, he ignored it and proceeded to his workplace. However, when the chest pain persisted, he drove himself to the emergency department. The chest pain is intermittent nonradiating, but it increased with breathing deeply. He denies diaphoresis, nausea, vomiting or dizziness. He also denies SOB. His chest pain is worsened by lying flat and it is somehow improved by sitting upright. However, he doesn't improve with leaning forward. He denies cough, fevers, chills, reduced appetite, wheezing. He also denies orthopnea, exertional dyspnea, or chest pain with exertion. Patient is active, and  this is the first time he has ever experienced chest pain. He has no previous cardiac evaluation. He denies any illicit drug use. Patient has not been recently on a long trip. No history of blood clots. He denies palpitations, no lower extremity edema.  His chest pain in the emergency department, was not relieved by Dilaudid 1 mg IV, aspirin, GI cocktail, or morphine 4 mg. Also sublingual nitroglycerin 0.4 mg x3, only slightly reduced his chest pain. However, 30 mg of Toradol IV significantly improved his chest pain   Hospital Course by problem list: Principal Problem:   Acute pericarditis Active Problems:   Smoking 1/2 pack a day or less   Excessive drinking alcohol   Low back pain  # Acute Pericarditis-  Pt presented with complaints of chest pain, which were pleuritic in nature, Clinical history seemed to point toward acute pericarditis as a possible cause of his  chest pain , especially given that his chest pain improved by sitting upright and was significantly relieved with Toradol and not with Dialudid, Nitroglycerin (both sublingual and IV drip), or morphine which were given in the ED,but initial EKG was not supportive, with up sloping St segment in lead 3 only.  Other etiologies that were considered, Cardiac ischemia which was ruled out, with 3 Negative troponins and EKG findings that were not supportive. Pneumothorax, aortic dissection and Pneumonia were excluded with normal vital signs and chest x-ray that was not suggestive. Low clinical index of suspicion, with no known risk factors for PE, but a Ddimer was done which was negative. Patient was admitted to Telemetry. ESR- WNL, CRP was elevated at 3.9, UDS- was negative, lipid panel- LDL- 95, TSH- WNL, HBA1c- 5.6. Pt was placed on IV toradol- Q8H.  EKG was repeated the next morning showed diffuse ST segment elevation in multiple leads, which was very suggestive of acute pericarditis, and on examination pericardial rubs were heard on chest  auscultation  when patient leaned forward. 2D echo ordered was negative for Pericardial effusion. ANA and Beta quantiferon were drawn to rule out Autoimmune dx and TB pericarditis as possible etiologies but these were both negative. Exact cause of pericarditis was therefore not known. Patient reported absence of chest pain on toradol, was therefore discharged to complete a 2 week course of Ibuprofen at 600mg  TID and Colchicine 0.6mg  daily for 3 months. And was counselled on the importance of adhering to treatment,and to follow up closely in clinic.   # Alcohol excessive use: Patient admitted to drinking 6 cans of 12 ounces of beer on a daily basis. Pt was placed on CIWA protocol on admission, but no symptoms of alcohol withdrawal developed during admission.  # Cigarette smoking: Nicotine patch.    Discharge Vitals:   BP 131/85  Pulse 78  Temp(Src) 98.5 F (36.9 C) (Oral)  Resp 17  Ht 5\' 10"  (1.778 m)  Wt 145 lb 14.4 oz (66.18 kg)  BMI 20.93 kg/m2  SpO2 100%  Discharge Labs:  Results for orders placed during the hospital encounter of 10/22/13 (from the past 24 hour(s))  CBC     Status: Abnormal   Collection Time    10/24/13  6:15 AM      Result Value Range   WBC 5.0  4.0 - 10.5 K/uL   RBC 4.13 (*) 4.22 - 5.81 MIL/uL   Hemoglobin 11.6 (*) 13.0 - 17.0 g/dL   HCT 16.1 (*) 09.6 - 04.5 %   MCV 83.3  78.0 - 100.0 fL   MCH 28.1  26.0 - 34.0 pg   MCHC 33.7  30.0 - 36.0 g/dL   RDW 40.9  81.1 - 91.4 %   Platelets 219  150 - 400 K/uL    Signed: Kennis Carina, MD 10/24/2013, 2:45 PM   Time Spent on Discharge: 35 minutes Services Ordered on Discharge: None. Equipment Ordered on Discharge: None.

## 2013-11-09 ENCOUNTER — Inpatient Hospital Stay: Payer: Self-pay | Admitting: Cardiology

## 2013-11-15 ENCOUNTER — Ambulatory Visit: Payer: Self-pay

## 2015-01-07 ENCOUNTER — Emergency Department (HOSPITAL_COMMUNITY)
Admission: EM | Admit: 2015-01-07 | Discharge: 2015-01-07 | Disposition: A | Discharge status: Home / Self Care | Payer: No Typology Code available for payment source | Attending: Emergency Medicine | Admitting: Emergency Medicine

## 2015-01-07 ENCOUNTER — Encounter (HOSPITAL_COMMUNITY): Payer: Self-pay | Admitting: *Deleted

## 2015-01-07 DIAGNOSIS — H9202 Otalgia, left ear: Secondary | ICD-10-CM | POA: Diagnosis present

## 2015-01-07 DIAGNOSIS — Z720 Tobacco use: Secondary | ICD-10-CM | POA: Insufficient documentation

## 2015-01-07 DIAGNOSIS — H6092 Unspecified otitis externa, left ear: Secondary | ICD-10-CM | POA: Diagnosis not present

## 2015-01-07 DIAGNOSIS — Z79899 Other long term (current) drug therapy: Secondary | ICD-10-CM | POA: Insufficient documentation

## 2015-01-07 DIAGNOSIS — Z791 Long term (current) use of non-steroidal anti-inflammatories (NSAID): Secondary | ICD-10-CM | POA: Diagnosis not present

## 2015-01-07 MED ORDER — TRAMADOL HCL 50 MG PO TABS: 50.0000 mg | ORAL_TABLET | Freq: Four times a day (QID) | ORAL | Status: DC | PRN

## 2015-01-07 MED ORDER — CIPROFLOXACIN-DEXAMETHASONE 0.3-0.1 % OT SUSP: 4.0000 [drp] | Freq: Two times a day (BID) | OTIC | Status: DC

## 2015-01-07 NOTE — ED Notes (Signed)
C/o bi-lateral ear pain for 3 days/  Lt worse than the rt
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
## 2015-01-08 ENCOUNTER — Encounter (HOSPITAL_COMMUNITY): Payer: Self-pay | Admitting: Emergency Medicine

## 2015-01-08 ENCOUNTER — Emergency Department (INDEPENDENT_AMBULATORY_CARE_PROVIDER_SITE_OTHER)
Admission: EM | Admit: 2015-01-08 | Discharge: 2015-01-08 | Disposition: A | Discharge status: Home / Self Care | Payer: 59 | Source: Home / Self Care | Attending: Family Medicine | Admitting: Family Medicine

## 2015-01-08 DIAGNOSIS — H6092 Unspecified otitis externa, left ear: Secondary | ICD-10-CM | POA: Diagnosis not present

## 2015-01-08 MED ORDER — NEOMYCIN-POLYMYXIN-HC 3.5-10000-1 OT SUSP: 4.0000 [drp] | Freq: Three times a day (TID) | OTIC | Status: DC

## 2015-01-08 MED ORDER — CEPHALEXIN 500 MG PO CAPS: 500.0000 mg | ORAL_CAPSULE | Freq: Four times a day (QID) | ORAL | Status: DC

## 2015-01-08 NOTE — Discharge Instructions (Signed)
Warm soak to ear before using ear drops, take all of medicine, see ear doctor if further problems.

## 2015-01-08 NOTE — ED Notes (Signed)
Bilateral ear pain, left worse than right ear.  Denies cough, cold or congestion
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
## 2015-04-08 ENCOUNTER — Encounter (HOSPITAL_COMMUNITY): Payer: Self-pay | Admitting: *Deleted

## 2015-04-08 ENCOUNTER — Emergency Department (HOSPITAL_COMMUNITY): Payer: No Typology Code available for payment source

## 2015-04-08 ENCOUNTER — Emergency Department (HOSPITAL_COMMUNITY)
Admission: EM | Admit: 2015-04-08 | Discharge: 2015-04-08 | Disposition: A | Discharge status: Home / Self Care | Payer: No Typology Code available for payment source | Attending: Emergency Medicine | Admitting: Emergency Medicine

## 2015-04-08 DIAGNOSIS — J159 Unspecified bacterial pneumonia: Secondary | ICD-10-CM | POA: Diagnosis not present

## 2015-04-08 DIAGNOSIS — Z792 Long term (current) use of antibiotics: Secondary | ICD-10-CM | POA: Insufficient documentation

## 2015-04-08 DIAGNOSIS — Z791 Long term (current) use of non-steroidal anti-inflammatories (NSAID): Secondary | ICD-10-CM | POA: Diagnosis not present

## 2015-04-08 DIAGNOSIS — Z720 Tobacco use: Secondary | ICD-10-CM | POA: Diagnosis not present

## 2015-04-08 DIAGNOSIS — R05 Cough: Secondary | ICD-10-CM | POA: Diagnosis present

## 2015-04-08 DIAGNOSIS — J189 Pneumonia, unspecified organism: Secondary | ICD-10-CM

## 2015-04-08 DIAGNOSIS — Z79899 Other long term (current) drug therapy: Secondary | ICD-10-CM | POA: Diagnosis not present

## 2015-04-08 MED ORDER — IBUPROFEN 800 MG PO TABS
800.0000 mg | ORAL_TABLET | Freq: Once | ORAL | Status: AC
  Administered 2015-04-08: 800 mg via ORAL
  Filled 2015-04-08: qty 1

## 2015-04-08 MED ORDER — AZITHROMYCIN 250 MG PO TABS
500.0000 mg | ORAL_TABLET | Freq: Once | ORAL | Status: AC
  Administered 2015-04-08: 500 mg via ORAL
  Filled 2015-04-08: qty 2

## 2015-04-08 MED ORDER — LIDOCAINE HCL (PF) 1 % IJ SOLN
5.0000 mL | Freq: Once | INTRAMUSCULAR | Status: AC
  Administered 2015-04-08: 5 mL

## 2015-04-08 MED ORDER — AZITHROMYCIN 250 MG PO TABS
ORAL_TABLET | ORAL | Status: DC
Start: 2015-04-08 — End: 2016-10-15

## 2015-04-08 MED ORDER — ACETAMINOPHEN 325 MG PO TABS
650.0000 mg | ORAL_TABLET | Freq: Once | ORAL | Status: AC
  Administered 2015-04-08: 650 mg via ORAL
  Filled 2015-04-08: qty 2

## 2015-04-08 MED ORDER — ALBUTEROL SULFATE HFA 108 (90 BASE) MCG/ACT IN AERS
2.0000 | INHALATION_SPRAY | Freq: Once | RESPIRATORY_TRACT | Status: AC
  Administered 2015-04-08: 2 via RESPIRATORY_TRACT
  Filled 2015-04-08: qty 6.7

## 2015-04-08 MED ORDER — CEFTRIAXONE SODIUM 1 G IJ SOLR
1.0000 g | Freq: Once | INTRAMUSCULAR | Status: AC
  Administered 2015-04-08: 1 g via INTRAMUSCULAR
  Filled 2015-04-08: qty 10

## 2015-04-08 NOTE — ED Notes (Signed)
The pt reports that he ahs been taking tylenol every day for 5 days and his temp continues.  Soreness through his chest from coughing also.  He is a smoker
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
## 2015-04-08 NOTE — ED Notes (Signed)
Pt placed in a gown and hooked up to the monitor with BP cuff and pulse ox 

## 2015-04-08 NOTE — ED Notes (Signed)
The pt has had a cough  With a temp and sweating for 5 days night sweats and aching all over.  Dry cough non-productive.  Ribs hurts when he coughs

## 2015-04-08 NOTE — ED Notes (Signed)
Pt  Ambulated with pulse ox attached.  sats consistently 96-97%

## 2015-04-08 NOTE — ED Notes (Signed)
Not exertionally sob

## 2015-07-14 ENCOUNTER — Emergency Department (HOSPITAL_COMMUNITY)
Admission: EM | Admit: 2015-07-14 | Discharge: 2015-07-14 | Disposition: A | Discharge status: Home / Self Care | Payer: Worker's Compensation | Attending: Emergency Medicine | Admitting: Emergency Medicine

## 2015-07-14 ENCOUNTER — Encounter (HOSPITAL_COMMUNITY): Payer: Self-pay | Admitting: Emergency Medicine

## 2015-07-14 DIAGNOSIS — Y9289 Other specified places as the place of occurrence of the external cause: Secondary | ICD-10-CM | POA: Insufficient documentation

## 2015-07-14 DIAGNOSIS — Z792 Long term (current) use of antibiotics: Secondary | ICD-10-CM | POA: Insufficient documentation

## 2015-07-14 DIAGNOSIS — S61411A Laceration without foreign body of right hand, initial encounter: Secondary | ICD-10-CM | POA: Insufficient documentation

## 2015-07-14 DIAGNOSIS — Y9389 Activity, other specified: Secondary | ICD-10-CM | POA: Insufficient documentation

## 2015-07-14 DIAGNOSIS — Z720 Tobacco use: Secondary | ICD-10-CM | POA: Diagnosis not present

## 2015-07-14 DIAGNOSIS — Z791 Long term (current) use of non-steroidal anti-inflammatories (NSAID): Secondary | ICD-10-CM | POA: Diagnosis not present

## 2015-07-14 DIAGNOSIS — Y288XXA Contact with other sharp object, undetermined intent, initial encounter: Secondary | ICD-10-CM | POA: Diagnosis not present

## 2015-07-14 DIAGNOSIS — Y998 Other external cause status: Secondary | ICD-10-CM | POA: Diagnosis not present

## 2015-07-14 MED ORDER — LIDOCAINE HCL (PF) 1 % IJ SOLN
10.0000 mL | Freq: Once | INTRAMUSCULAR | Status: AC
  Administered 2015-07-14: 10 mL
  Filled 2015-07-14: qty 10

## 2015-07-14 MED ORDER — BACITRACIN ZINC 500 UNIT/GM EX OINT
TOPICAL_OINTMENT | Freq: Two times a day (BID) | CUTANEOUS | Status: DC
  Administered 2015-07-14: 1 via TOPICAL

## 2015-07-14 MED ORDER — IBUPROFEN 800 MG PO TABS: 800.0000 mg | ORAL_TABLET | Freq: Three times a day (TID) | ORAL | Status: DC | PRN
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
## 2015-07-14 NOTE — ED Notes (Signed)
Pt. presents with laceration approx. 1" at right hand sustained from a sharp edge of plastic garbage can this evening with mild bleeding , dressing applied at triage .

## 2015-07-14 NOTE — Discharge Instructions (Signed)
Read the information below.  Use the prescribed medication as directed.  Please discuss all new medications with your pharmacist.  You may return to the Emergency Department at any time for worsening condition or any new symptoms that concern you.    If you develop redness, swelling, pus draining from the wound, or fevers greater than 100.4, return to the ER immediately for a recheck.  Keep the wound clean and covered with a thin layer of antibiotic ointment while the wound heals.  The sutures will absorb on their own.  You may have them taken out in 2 weeks if the wound is well healed and you would like them out.     Laceration Care, Adult A laceration is a cut or lesion that goes through all layers of the skin and into the tissue just beneath the skin. TREATMENT  Some lacerations may not require closure. Some lacerations may not be able to be closed due to an increased risk of infection. It is important to see your caregiver as soon as possible after an injury to minimize the risk of infection and maximize the opportunity for successful closure. If closure is appropriate, pain medicines may be given, if needed. The wound will be cleaned to help prevent infection. Your caregiver will use stitches (sutures), staples, wound glue (adhesive), or skin adhesive strips to repair the laceration. These tools bring the skin edges together to allow for faster healing and a better cosmetic outcome. However, all wounds will heal with a scar. Once the wound has healed, scarring can be minimized by covering the wound with sunscreen during the day for 1 full year. HOME CARE INSTRUCTIONS  For sutures or staples:  Keep the wound clean and dry.  If you were given a bandage (dressing), you should change it at least once a day. Also, change the dressing if it becomes wet or dirty, or as directed by your caregiver.  Wash the wound with soap and water 2 times a day. Rinse the wound off with water to remove all soap. Pat  the wound dry with a clean towel.  After cleaning, apply a thin layer of the antibiotic ointment as recommended by your caregiver. This will help prevent infection and keep the dressing from sticking.  You may shower as usual after the first 24 hours. Do not soak the wound in water until the sutures are removed.  Only take over-the-counter or prescription medicines for pain, discomfort, or fever as directed by your caregiver.  Get your sutures or staples removed as directed by your caregiver. For skin adhesive strips:  Keep the wound clean and dry.  Do not get the skin adhesive strips wet. You may bathe carefully, using caution to keep the wound dry.  If the wound gets wet, pat it dry with a clean towel.  Skin adhesive strips will fall off on their own. You may trim the strips as the wound heals. Do not remove skin adhesive strips that are still stuck to the wound. They will fall off in time. For wound adhesive:  You may briefly wet your wound in the shower or bath. Do not soak or scrub the wound. Do not swim. Avoid periods of heavy perspiration until the skin adhesive has fallen off on its own. After showering or bathing, gently pat the wound dry with a clean towel.  Do not apply liquid medicine, cream medicine, or ointment medicine to your wound while the skin adhesive is in place. This may loosen the film  before your wound is healed.  If a dressing is placed over the wound, be careful not to apply tape directly over the skin adhesive. This may cause the adhesive to be pulled off before the wound is healed.  Avoid prolonged exposure to sunlight or tanning lamps while the skin adhesive is in place. Exposure to ultraviolet light in the first year will darken the scar.  The skin adhesive will usually remain in place for 5 to 10 days, then naturally fall off the skin. Do not pick at the adhesive film. You may need a tetanus shot if:  You cannot remember when you had your last tetanus  shot.  You have never had a tetanus shot. If you get a tetanus shot, your arm may swell, get red, and feel warm to the touch. This is common and not a problem. If you need a tetanus shot and you choose not to have one, there is a rare chance of getting tetanus. Sickness from tetanus can be serious. SEEK MEDICAL CARE IF:   You have redness, swelling, or increasing pain in the wound.  You see a red line that goes away from the wound.  You have yellowish-white fluid (pus) coming from the wound.  You have a fever.  You notice a bad smell coming from the wound or dressing.  Your wound breaks open before or after sutures have been removed.  You notice something coming out of the wound such as wood or glass.  Your wound is on your hand or foot and you cannot move a finger or toe. SEEK IMMEDIATE MEDICAL CARE IF:   Your pain is not controlled with prescribed medicine.  You have severe swelling around the wound causing pain and numbness or a change in color in your arm, hand, leg, or foot.  Your wound splits open and starts bleeding.  You have worsening numbness, weakness, or loss of function of any joint around or beyond the wound.  You develop painful lumps near the wound or on the skin anywhere on your body. MAKE SURE YOU:   Understand these instructions.  Will watch your condition.  Will get help right away if you are not doing well or get worse. Document Released: 10/20/2005 Document Revised: 01/12/2012 Document Reviewed: 04/15/2011 [facility] Patient Information 2015 [place], [place]. This information is not intended to replace advice given to you by your health care provider. Make sure you discuss any questions you have with your health care provider.   Sutured Wound Care Sutures are stitches that can be used to close wounds. Wound care helps prevent pain and infection.  HOME CARE INSTRUCTIONS   Rest and elevate the injured area until all the pain and swelling are  gone.  Only take over-the-counter or prescription medicines for pain, discomfort, or fever as directed by your caregiver.  After 48 hours, gently wash the area with mild soap and water once a day, or as directed. Rinse off the soap. Pat the area dry with a clean towel. Do not rub the wound. This may cause bleeding.  Follow your caregiver's instructions for how often to change the bandage (dressing). Stop using a dressing after 2 days or after the wound stops draining.  If the dressing sticks, moisten it with soapy water and gently remove it.  Apply ointment on the wound as directed.  Avoid stretching a sutured wound.  Drink enough fluids to keep your urine clear or pale yellow.  Follow up with your caregiver for suture removal as  directed.  Use sunscreen on your wound for the next 3 to 6 months so the scar will not darken. SEEK IMMEDIATE MEDICAL CARE IF:   Your wound becomes red, swollen, hot, or tender.  You have increasing pain in the wound.  You have a red streak that extends from the wound.  There is pus coming from the wound.  You have a fever.  You have shaking chills.  There is a bad smell coming from the wound.  You have persistent bleeding from the wound. MAKE SURE YOU:   Understand these instructions.  Will watch your condition.  Will get help right away if you are not doing well or get worse. Document Released: 11/27/2004 Document Revised: 01/12/2012 Document Reviewed: 02/23/2011 [facility] Patient Information 2015 [place], [place]. This information is not intended to replace advice given to you by your health care provider. Make sure you discuss any questions you have with your health care provider.

## 2015-07-17 ENCOUNTER — Emergency Department (INDEPENDENT_AMBULATORY_CARE_PROVIDER_SITE_OTHER)
Admission: EM | Admit: 2015-07-17 | Discharge: 2015-07-17 | Disposition: A | Discharge status: Home / Self Care | Payer: Worker's Compensation | Source: Home / Self Care | Attending: Emergency Medicine | Admitting: Emergency Medicine

## 2015-07-17 ENCOUNTER — Encounter (HOSPITAL_COMMUNITY): Payer: Self-pay | Admitting: Emergency Medicine

## 2015-07-17 DIAGNOSIS — S61411D Laceration without foreign body of right hand, subsequent encounter: Secondary | ICD-10-CM | POA: Diagnosis not present
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
## 2015-07-17 NOTE — Discharge Instructions (Signed)
I have provided a work note for you. Please come back if the cut starts to look red, swollen, more painful, or is draining.

## 2015-07-17 NOTE — ED Notes (Signed)
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

## 2015-07-29 ENCOUNTER — Emergency Department (INDEPENDENT_AMBULATORY_CARE_PROVIDER_SITE_OTHER)
Admission: EM | Admit: 2015-07-29 | Discharge: 2015-07-29 | Disposition: A | Discharge status: Home / Self Care | Payer: Worker's Compensation | Source: Home / Self Care | Attending: Emergency Medicine | Admitting: Emergency Medicine

## 2015-07-29 ENCOUNTER — Encounter (HOSPITAL_COMMUNITY): Payer: Self-pay | Admitting: Emergency Medicine

## 2015-07-29 DIAGNOSIS — Z4802 Encounter for removal of sutures: Secondary | ICD-10-CM | POA: Diagnosis not present
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
## 2015-07-29 NOTE — Discharge Instructions (Signed)

## 2016-05-03 ENCOUNTER — Emergency Department (HOSPITAL_COMMUNITY)
Admission: EM | Admit: 2016-05-03 | Discharge: 2016-05-03 | Disposition: A | Discharge status: Home / Self Care | Payer: BLUE CROSS/BLUE SHIELD | Attending: Emergency Medicine | Admitting: Emergency Medicine

## 2016-05-03 ENCOUNTER — Encounter (HOSPITAL_COMMUNITY): Payer: Self-pay | Admitting: Emergency Medicine

## 2016-05-03 DIAGNOSIS — K0889 Other specified disorders of teeth and supporting structures: Secondary | ICD-10-CM | POA: Diagnosis not present

## 2016-05-03 DIAGNOSIS — Z79899 Other long term (current) drug therapy: Secondary | ICD-10-CM | POA: Diagnosis not present

## 2016-05-03 DIAGNOSIS — R220 Localized swelling, mass and lump, head: Secondary | ICD-10-CM | POA: Insufficient documentation

## 2016-05-03 DIAGNOSIS — F17200 Nicotine dependence, unspecified, uncomplicated: Secondary | ICD-10-CM | POA: Insufficient documentation

## 2016-05-03 MED ORDER — OXYCODONE-ACETAMINOPHEN 5-325 MG PO TABS
2.0000 | ORAL_TABLET | Freq: Once | ORAL | Status: AC
  Administered 2016-05-03: 2 via ORAL
  Filled 2016-05-03: qty 2

## 2016-05-03 MED ORDER — KETOROLAC TROMETHAMINE 60 MG/2ML IM SOLN
60.0000 mg | Freq: Once | INTRAMUSCULAR | Status: DC
  Filled 2016-05-03: qty 2

## 2016-05-03 MED ORDER — PENICILLIN V POTASSIUM 500 MG PO TABS: 500.0000 mg | ORAL_TABLET | Freq: Four times a day (QID) | ORAL | Status: AC

## 2016-05-03 MED ORDER — BUPIVACAINE-EPINEPHRINE (PF) 0.5% -1:200000 IJ SOLN
1.8000 mL | Freq: Once | INTRAMUSCULAR | Status: AC
  Administered 2016-05-03: 1.8 mL

## 2016-05-03 MED ORDER — NAPROXEN 500 MG PO TABS: 500.0000 mg | ORAL_TABLET | Freq: Two times a day (BID) | ORAL | Status: DC

## 2016-05-03 MED ORDER — LIDOCAINE VISCOUS 2 % MT SOLN: 15.0000 mL | OROMUCOSAL | Status: DC | PRN

## 2016-05-03 NOTE — ED Notes (Signed)
Pt declined Toradol

## 2016-05-03 NOTE — Discharge Instructions (Signed)
You have been seen today for dental pain. You should follow up with a dentist as soon as possible, but certainly within the timeframe you are taking the antibiotic. Call the provided number above or use the resource guide below to help with finding a dentist. Follow up with PCP as needed.  Dental Resource Guide  [facility] [address], [address] [place], [place] [address] [phone]  [facility] [place] [address] [place], [place] [address] [phone]) [phone]  [facility] [address] [place], [place] [address] [phone]) [phone]. [phone]  [facility] [address] [place], [place] [address] [phone]  [facility] [address] [place], [place] [address] [phone]  [facility] [employer] [email or web address]  [employer] [address] [facility] [place], [place] [address] [phone]  Website for free, low-income, or sliding scale dental services in [place]: www.freedental.us  To find a dentist in [place] and surrounding areas: [email or web address]-the-[email or web address]-[email or web address]  [facility] [email or web address]/[email or web address]-missions-[email or web address]-mercy  [facility] Medicaid Dentist http://[email or web address]/https://dma.[email or web address]/find-[email or web address]-doctor/[email or web address]-[email or web address]-providers
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
## 2016-05-03 NOTE — ED Notes (Signed)
States started with right upper dental pain last pm. Took Aleve. Woke this am with right facial swelling.

## 2016-05-05 ENCOUNTER — Emergency Department (HOSPITAL_COMMUNITY): Payer: BLUE CROSS/BLUE SHIELD

## 2016-05-05 ENCOUNTER — Encounter (HOSPITAL_COMMUNITY): Payer: Self-pay

## 2016-05-05 ENCOUNTER — Emergency Department (HOSPITAL_COMMUNITY)
Admission: EM | Admit: 2016-05-05 | Discharge: 2016-05-05 | Disposition: A | Discharge status: Home / Self Care | Payer: BLUE CROSS/BLUE SHIELD | Attending: Emergency Medicine | Admitting: Emergency Medicine

## 2016-05-05 DIAGNOSIS — F17200 Nicotine dependence, unspecified, uncomplicated: Secondary | ICD-10-CM | POA: Insufficient documentation

## 2016-05-05 DIAGNOSIS — W010XXA Fall on same level from slipping, tripping and stumbling without subsequent striking against object, initial encounter: Secondary | ICD-10-CM | POA: Diagnosis not present

## 2016-05-05 DIAGNOSIS — Z79899 Other long term (current) drug therapy: Secondary | ICD-10-CM | POA: Insufficient documentation

## 2016-05-05 DIAGNOSIS — M791 Myalgia, unspecified site: Secondary | ICD-10-CM

## 2016-05-05 DIAGNOSIS — Y929 Unspecified place or not applicable: Secondary | ICD-10-CM | POA: Insufficient documentation

## 2016-05-05 DIAGNOSIS — Y990 Civilian activity done for income or pay: Secondary | ICD-10-CM | POA: Insufficient documentation

## 2016-05-05 DIAGNOSIS — Y9389 Activity, other specified: Secondary | ICD-10-CM | POA: Diagnosis not present

## 2016-05-05 DIAGNOSIS — M25512 Pain in left shoulder: Secondary | ICD-10-CM | POA: Diagnosis present

## 2016-05-05 MED ORDER — CYCLOBENZAPRINE HCL 5 MG PO TABS: 5.0000 mg | ORAL_TABLET | Freq: Three times a day (TID) | ORAL | Status: DC | PRN

## 2016-05-05 MED ORDER — ACETAMINOPHEN 325 MG PO TABS
650.0000 mg | ORAL_TABLET | Freq: Once | ORAL | Status: AC
  Administered 2016-05-05: 650 mg via ORAL
  Filled 2016-05-05: qty 2

## 2016-05-05 NOTE — Discharge Instructions (Signed)
Read the information below.   Your x-rays were re-assuring. For the next 24-48 hours apply ice for 20 minute increments to affected area. Following 48 hours he can apply heat. Take Aleve or Tylenol as needed for pain and inflammation control. I have prescribed a muscle relaxer as well. The muscle relaxer can make you drowsy do not drive or operate heavy machinery while taking. Activity as tolerated. Your blood pressure is elevated today. It is important that you monitoring her pressure. Follow up with a primary care provider regarding your blood pressure. A contact number is provided discharge instructions for establishing a primary care provider please call. Use the prescribed medication as directed.  Please discuss all new medications with your pharmacist.   You may return to the Emergency Department at any time for worsening condition or any new symptoms that concern you. Return to the emergency department if you develop a fever, cervical neck pain, inability to move arm, numbness or weakness in her arm, facial droop, slurred speech, changes in vision.   Musculoskeletal Pain Musculoskeletal pain is muscle and boney aches and pains. These pains can occur in any part of the body. Your caregiver may treat you without knowing the cause of the pain. They may treat you if blood or urine tests, X-rays, and other tests were normal.  CAUSES There is often not a definite cause or reason for these pains. These pains may be caused by a type of germ (virus). The discomfort may also come from overuse. Overuse includes working out too hard when your body is not fit. Boney aches also come from weather changes. Bone is sensitive to atmospheric pressure changes. HOME CARE INSTRUCTIONS   Ask when your test results will be ready. Make sure you get your test results.  Only take over-the-counter or prescription medicines for pain, discomfort, or fever as directed by your caregiver. If you were given medications for your  condition, do not drive, operate machinery or power tools, or sign legal documents for 24 hours. Do not drink alcohol. Do not take sleeping pills or other medications that may interfere with treatment.  Continue all activities unless the activities cause more pain. When the pain lessens, slowly resume normal activities. Gradually increase the intensity and duration of the activities or exercise.  During periods of severe pain, bed rest may be helpful. Lay or sit in any position that is comfortable.  Putting ice on the injured area.  Put ice in a bag.  Place a towel between your skin and the bag.  Leave the ice on for 15 to 20 minutes, 3 to 4 times a day.  Follow up with your caregiver for continued problems and no reason can be found for the pain. If the pain becomes worse or does not go away, it may be necessary to repeat tests or do additional testing. Your caregiver may need to look further for a possible cause. SEEK IMMEDIATE MEDICAL CARE IF:  You have pain that is getting worse and is not relieved by medications.  You develop chest pain that is associated with shortness or breath, sweating, feeling sick to your stomach (nauseous), or throw up (vomit).  Your pain becomes localized to the abdomen.  You develop any new symptoms that seem different or that concern you. MAKE SURE YOU:   Understand these instructions.  Will watch your condition.  Will get help right away if you are not doing well or get worse.   This information is not intended to replace  advice given to you by your health care provider. Make sure you discuss any questions you have with your health care provider.   Document Released: 10/20/2005 Document Revised: 01/12/2012 Document Reviewed: 06/24/2013 Elsevier Interactive Patient Education [employer]2016 [employer]

## 2016-05-05 NOTE — ED Notes (Signed)
Patient returned from xray.
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
## 2016-05-05 NOTE — ED Notes (Signed)
Declined W/C at D/C and was escorted to lobby by RN. 

## 2016-05-05 NOTE — ED Notes (Signed)
Patient complains of left shoulder/clavicle pain after slipping at work last night striking counter with shoulder. NAD

## 2016-10-12 ENCOUNTER — Emergency Department (HOSPITAL_COMMUNITY)
Admission: EM | Admit: 2016-10-12 | Discharge: 2016-10-13 | Disposition: A | Discharge status: Home / Self Care | Payer: No Typology Code available for payment source | Attending: Emergency Medicine | Admitting: Emergency Medicine

## 2016-10-12 ENCOUNTER — Encounter (HOSPITAL_COMMUNITY): Payer: Self-pay | Admitting: Emergency Medicine

## 2016-10-12 DIAGNOSIS — Z79899 Other long term (current) drug therapy: Secondary | ICD-10-CM | POA: Insufficient documentation

## 2016-10-12 DIAGNOSIS — Y929 Unspecified place or not applicable: Secondary | ICD-10-CM | POA: Insufficient documentation

## 2016-10-12 DIAGNOSIS — Y93G1 Activity, food preparation and clean up: Secondary | ICD-10-CM | POA: Insufficient documentation

## 2016-10-12 DIAGNOSIS — M545 Low back pain: Secondary | ICD-10-CM | POA: Insufficient documentation

## 2016-10-12 DIAGNOSIS — F17210 Nicotine dependence, cigarettes, uncomplicated: Secondary | ICD-10-CM | POA: Insufficient documentation

## 2016-10-12 DIAGNOSIS — X501XXA Overexertion from prolonged static or awkward postures, initial encounter: Secondary | ICD-10-CM | POA: Insufficient documentation

## 2016-10-12 DIAGNOSIS — Y990 Civilian activity done for income or pay: Secondary | ICD-10-CM | POA: Insufficient documentation

## 2016-10-12 MED ORDER — KETOROLAC TROMETHAMINE 60 MG/2ML IM SOLN: 60.0000 mg | Freq: Once | INTRAMUSCULAR | Status: DC

## 2016-10-12 MED ORDER — CYCLOBENZAPRINE HCL 10 MG PO TABS
10.0000 mg | ORAL_TABLET | Freq: Once | ORAL | Status: AC
  Administered 2016-10-12: 10 mg via ORAL
  Filled 2016-10-12: qty 1

## 2016-10-12 MED ORDER — LIDOCAINE 5 % EX PTCH
1.0000 | MEDICATED_PATCH | CUTANEOUS | Status: DC
  Administered 2016-10-12: 1 via TRANSDERMAL
  Filled 2016-10-12: qty 1

## 2016-10-12 MED ORDER — KETOROLAC TROMETHAMINE 30 MG/ML IJ SOLN: 60.0000 mg | Freq: Once | INTRAMUSCULAR | Status: DC

## 2016-10-12 MED ORDER — CYCLOBENZAPRINE HCL 5 MG PO TABS: 5.0000 mg | ORAL_TABLET | Freq: Three times a day (TID) | ORAL | 0 refills | Status: DC | PRN

## 2016-10-12 MED ORDER — NAPROXEN 500 MG PO TABS: 500.0000 mg | ORAL_TABLET | Freq: Two times a day (BID) | ORAL | 0 refills | Status: DC

## 2016-10-12 MED ORDER — KETOROLAC TROMETHAMINE 60 MG/2ML IM SOLN
60.0000 mg | Freq: Once | INTRAMUSCULAR | Status: AC
  Administered 2016-10-12: 60 mg via INTRAMUSCULAR
  Filled 2016-10-12: qty 2

## 2016-10-12 NOTE — ED Notes (Signed)
ED Provider at bedside. 
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
## 2016-10-12 NOTE — ED Triage Notes (Signed)
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 Pt reports pain worse with sitting. Pt states this will be a workman's comp claim. Pt denies any urinary symptoms at this time.

## 2016-10-12 NOTE — Discharge Instructions (Signed)
Read the information below.  This may be muscle spasm. I have prescribed naprosyn and flexeril for pain relief. While taking naprosyn do not take other NSAIDs (ibuprofen, motrin, or aleve). Flexeril can make you drowsy, do not drive after taking.  You can apply heat or ice for 20 minute increments for the next 2-3 days.  Follow up with your primary provider within the next week for re-evaluation.  Use the prescribed medication as directed.  Please discuss all new medications with your pharmacist.   You may return to the Emergency Department at any time for worsening condition or any new symptoms that concern you. Return to ED if you develop loss of bowel or bladder function, fever, numbness, weakness, or any other new/concerning symptoms.

## 2016-10-13 ENCOUNTER — Encounter (HOSPITAL_COMMUNITY): Payer: Self-pay

## 2016-10-13 DIAGNOSIS — S39012D Strain of muscle, fascia and tendon of lower back, subsequent encounter: Secondary | ICD-10-CM | POA: Insufficient documentation

## 2016-10-13 DIAGNOSIS — S3992XD Unspecified injury of lower back, subsequent encounter: Secondary | ICD-10-CM | POA: Diagnosis present

## 2016-10-13 DIAGNOSIS — X501XXD Overexertion from prolonged static or awkward postures, subsequent encounter: Secondary | ICD-10-CM | POA: Insufficient documentation

## 2016-10-13 DIAGNOSIS — Z79899 Other long term (current) drug therapy: Secondary | ICD-10-CM | POA: Insufficient documentation

## 2016-10-13 DIAGNOSIS — F17210 Nicotine dependence, cigarettes, uncomplicated: Secondary | ICD-10-CM | POA: Diagnosis not present

## 2016-10-13 NOTE — ED Notes (Signed)
Patient was alert, oriented and stable upon discharge. RN went over AVS and patient had no further questions.  

## 2016-10-13 NOTE — ED Triage Notes (Signed)
Pt hurt his back at work yesterday and was seen last night, he states the pain medication isn't working and his back is swelling on the right lower side

## 2016-10-14 ENCOUNTER — Emergency Department (HOSPITAL_COMMUNITY)
Admission: EM | Admit: 2016-10-14 | Discharge: 2016-10-14 | Disposition: A | Discharge status: Home / Self Care | Payer: BLUE CROSS/BLUE SHIELD | Attending: Emergency Medicine | Admitting: Emergency Medicine

## 2016-10-14 DIAGNOSIS — S39012D Strain of muscle, fascia and tendon of lower back, subsequent encounter: Secondary | ICD-10-CM

## 2016-10-14 MED ORDER — OXYCODONE-ACETAMINOPHEN 5-325 MG PO TABS: 1.0000 | ORAL_TABLET | ORAL | 0 refills | Status: DC | PRN

## 2016-10-14 MED ORDER — OXYCODONE-ACETAMINOPHEN 5-325 MG PO TABS
1.0000 | ORAL_TABLET | Freq: Once | ORAL | Status: AC
  Administered 2016-10-14: 1 via ORAL
  Filled 2016-10-14: qty 1

## 2016-10-14 MED ORDER — ORPHENADRINE CITRATE ER 100 MG PO TB12: 100.0000 mg | ORAL_TABLET | Freq: Two times a day (BID) | ORAL | 0 refills | Status: DC

## 2016-10-14 NOTE — ED Notes (Signed)
Pt reporting no improvement in pain after being seen on 10/13/16 for lower back pain from injury at work.
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
## 2016-10-15 ENCOUNTER — Encounter: Payer: Self-pay | Admitting: Family Medicine

## 2016-10-15 ENCOUNTER — Ambulatory Visit (INDEPENDENT_AMBULATORY_CARE_PROVIDER_SITE_OTHER): Payer: Worker's Compensation | Admitting: Family Medicine

## 2016-10-15 VITALS — BP 102/78 | HR 92 | Temp 98.7°F | Resp 18 | Ht 70.0 in | Wt 146.6 lb

## 2016-10-15 DIAGNOSIS — K5903 Drug induced constipation: Secondary | ICD-10-CM

## 2016-10-15 DIAGNOSIS — Z0000 Encounter for general adult medical examination without abnormal findings: Secondary | ICD-10-CM

## 2016-10-15 DIAGNOSIS — Z23 Encounter for immunization: Secondary | ICD-10-CM

## 2016-10-15 DIAGNOSIS — Z125 Encounter for screening for malignant neoplasm of prostate: Secondary | ICD-10-CM

## 2016-10-15 DIAGNOSIS — Z131 Encounter for screening for diabetes mellitus: Secondary | ICD-10-CM

## 2016-10-15 DIAGNOSIS — M545 Low back pain: Secondary | ICD-10-CM

## 2016-10-15 DIAGNOSIS — F17200 Nicotine dependence, unspecified, uncomplicated: Secondary | ICD-10-CM

## 2016-10-15 LAB — POCT URINALYSIS DIP (MANUAL ENTRY)
Bilirubin, UA: NEGATIVE
Glucose, UA: NEGATIVE
Ketones, POC UA: NEGATIVE
Leukocytes, UA: NEGATIVE
NITRITE UA: NEGATIVE
PH UA: 5.5
RBC UA: NEGATIVE
Spec Grav, UA: 1.02
Urobilinogen, UA: 1

## 2016-10-15 MED ORDER — DOCUSATE SODIUM 100 MG PO CAPS: 100.0000 mg | ORAL_CAPSULE | Freq: Two times a day (BID) | ORAL | 0 refills | Status: AC
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
## 2016-10-15 NOTE — Patient Instructions (Addendum)
Aspercreme with Lidocaine over the counter for back muscle spasms     IF you received an x-ray today, you will receive an invoice [facility]. Please contact [facility] at [phone] with questions or concerns regarding your invoice.   IF you received labwork today, you will receive an invoice from [facility]. Please contact [facility] at [phone] with questions or concerns regarding your invoice.   Our billing staff will not be able to assist you with questions regarding bills from these companies.  You will be contacted with the lab results as soon as they are available. The fastest way to get your results is to activate your My Chart account. Instructions are located on the last page of this paperwork. If you have not heard from us regarding the results in 2 weeks, please contact this office.      Tobacco Use Disorder Tobacco use disorder (TUD) is a mental disorder. It is the long-term use of tobacco in spite of related health problems or difficulty with normal life activities. Tobacco is most commonly smoked as cigarettes and less commonly as cigars or pipes. Smokeless chewing tobacco and snuff are also popular. People with TUD get a feeling of extreme pleasure (euphoria) from using tobacco and have a desire to use it again and again. Repeated use of tobacco can cause problems. The addictive effects of tobacco are due mainly tothe ingredient nicotine. Nicotine also causes a rush of adrenaline (epinephrine) in the body. This leads to increased blood pressure, heart rate, and breathing rate. These changes may cause problems for people with high blood pressure, weak hearts, or lung disease. High doses of nicotine in children and pets can lead to seizures and death. Tobacco contains a number of other unsafe chemicals. These chemicals are especially harmful when inhaled as smoke and can damage almost every organ in the body. Smokers live  shorter lives than nonsmokers and are at risk of dying from a number of diseases and cancers. Tobacco smoke can also cause health problems for nonsmokers (due to inhaling secondhand smoke). Smoking is also a fire hazard. TUD usually starts in the late teenage years and is most common in young adults between the ages of 1318 and 25 years. People who start smoking earlier in life are more likely to continue smoking as adults. TUD is somewhat more common in men than women. People with TUD are at higher risk for using alcohol and other drugs of abuse. What increases the risk? Risk factors for TUD include:  Having family members with the disorder.  Being around people who use tobacco.  Having an existing mental health issue such as schizophrenia, depression, bipolar disorder, ADHD, or posttraumatic stress disorder (PTSD). What are the signs or symptoms? People with tobacco use disorder have two or more of the following signs and symptoms within 12 months:  Use of more tobacco over a longer period than intended.  Not able to cut down or control tobacco use.  A lot of time spent obtaining or using tobacco.  Strong desire or urge to use tobacco (craving). Cravings may last for 6 months or longer after quitting.  Use of tobacco even when use leads to major problems at work, school, or home.  Use of tobacco even when use leads to relationship problems.  Giving up or cutting down on important life activities because of tobacco use.  Repeatedly using tobacco in situations where it puts you or others in physical danger, like smoking in bed.  Use of tobacco even when it is known that a physical or mental problem is likely related to tobacco use.  Physical problems are numerous and may include chronic bronchitis, emphysema, lung and other cancers, gum disease, high blood pressure, heart disease, and stroke.  Mental problems caused by tobacco may include difficulty sleeping and anxiety.  Need to use  greater amounts of tobacco to get the same effect. This means you have developed a tolerance.  Withdrawal symptoms as a result of stopping or rapidly cutting back use. These symptoms may last a month or more after quitting and include the following:  Depressed, anxious, or irritable mood.  Difficulty concentrating.  Increased appetite.  Restlessness or trouble sleeping.  Use of tobacco to avoid withdrawal symptoms. How is this diagnosed? Tobacco use disorder is diagnosed by your health care provider. A diagnosis may be made by:  Your health care provider asking questions about your tobacco use and any problems it may be causing.  A physical exam.  Lab tests.  You may be referred to a mental health professional or addiction specialist. The severity of tobacco use disorder depends on the number of signs and symptoms you have:  Mild-Two or three symptoms.  Moderate-Four or five symptoms.  Severe-Six or more symptoms. How is this treated? Many people with tobacco use disorder are unable to quit on their own and need help. Treatment options include the following:  Nicotine replacement therapy (NRT). NRT provides nicotine without the other harmful chemicals in tobacco. NRT gradually lowers the dosage of nicotine in the body and reduces withdrawal symptoms. NRT is available in over-the-counter forms (gum, lozenges, and skin patches) as well as prescription forms (mouth inhaler and nasal spray).  Medicines.This may include:  Antidepressant medicine that may reduce nicotine cravings.  A medicine that acts on nicotine receptors in the brain to reduce cravings and withdrawal symptoms. It may also block the effects of tobacco in people with TUD who relapse.  Counseling or talk therapy. A form of talk therapy called behavioral therapy is commonly used to treat people with TUD. Behavioral therapy looks at triggers for tobacco use, how to avoid them, and how to cope with cravings. It is  most effective in person or by phone but is also available in self-help forms (books and Internet websites).  Support groups. These provide emotional support, advice, and guidance for quitting tobacco. The most effective treatment for TUD is usually a combination of medicine, talk therapy, and support groups. Follow these instructions at home:  Keep all follow-up visits as directed by your health care provider. This is important.  Take medicines only as directed by your health care provider.  Check with your health care provider before starting new prescription or over-the-counter medicines. Contact a health care provider if:  You are not able to take your medicines as prescribed.  Treatment is not helping your TUD and your symptoms get worse. Get help right away if:  You have serious thoughts about hurting yourself or others.  You have trouble breathing, chest pain, sudden weakness, or sudden numbness in part of your body. This information is not intended to replace advice given to you by your health care provider. Make sure you discuss any questions you have with your health care provider. Document Released: 06/25/2004 Document Revised: 06/22/2016 Document Reviewed: 12/16/2013 Elsevier Interactive Patient Education  2017 [employer]

## 2016-10-16 LAB — LIPID PANEL
CHOLESTEROL TOTAL: 250 mg/dL — AB (ref 100–199)
Chol/HDL Ratio: 3.8 ratio units (ref 0.0–5.0)
HDL: 65 mg/dL (ref >39)
LDL CALC: 164 mg/dL — AB (ref 0–99)
Triglycerides: 106 mg/dL (ref 0–149)
VLDL Cholesterol Cal: 21 mg/dL (ref 5–40)

## 2016-10-16 LAB — COMPREHENSIVE METABOLIC PANEL
ALK PHOS: 68 IU/L (ref 39–117)
ALT: 25 IU/L (ref 0–44)
AST: 31 IU/L (ref 0–40)
Albumin/Globulin Ratio: 1.6 (ref 1.2–2.2)
Albumin: 4.6 g/dL (ref 3.5–5.5)
BILIRUBIN TOTAL: 0.9 mg/dL (ref 0.0–1.2)
BUN/Creatinine Ratio: 13 (ref 9–20)
BUN: 14 mg/dL (ref 6–24)
CHLORIDE: 100 mmol/L (ref 96–106)
CO2: 23 mmol/L (ref 18–29)
Calcium: 9.4 mg/dL (ref 8.7–10.2)
Creatinine, Ser: 1.09 mg/dL (ref 0.76–1.27)
GFR calc Af Amer: 87 mL/min/{1.73_m2} (ref >59)
GFR calc non Af Amer: 75 mL/min/{1.73_m2} (ref >59)
GLOBULIN, TOTAL: 2.9 g/dL (ref 1.5–4.5)
Glucose: 116 mg/dL — ABNORMAL HIGH (ref 65–99)
POTASSIUM: 3.9 mmol/L (ref 3.5–5.2)
SODIUM: 139 mmol/L (ref 134–144)
Total Protein: 7.5 g/dL (ref 6.0–8.5)

## 2016-10-16 LAB — TSH: TSH: 4.38 u[IU]/mL (ref 0.450–4.500)

## 2016-10-16 LAB — HEMOGLOBIN A1C
ESTIMATED AVERAGE GLUCOSE: 108 mg/dL
Hgb A1c MFr Bld: 5.4 % (ref 4.8–5.6)

## 2016-10-16 LAB — PSA: Prostate Specific Ag, Serum: 0.8 ng/mL (ref 0.0–4.0)

## 2016-10-17 ENCOUNTER — Ambulatory Visit: Payer: Worker's Compensation

## 2016-10-17 ENCOUNTER — Telehealth: Payer: Self-pay

## 2016-10-17 ENCOUNTER — Ambulatory Visit (INDEPENDENT_AMBULATORY_CARE_PROVIDER_SITE_OTHER): Payer: BLUE CROSS/BLUE SHIELD

## 2016-10-17 ENCOUNTER — Other Ambulatory Visit: Payer: Self-pay | Admitting: Physician Assistant

## 2016-10-17 ENCOUNTER — Ambulatory Visit (INDEPENDENT_AMBULATORY_CARE_PROVIDER_SITE_OTHER): Payer: Worker's Compensation | Admitting: Physician Assistant

## 2016-10-17 VITALS — BP 140/88 | HR 68 | Temp 98.7°F | Resp 17 | Ht 70.0 in | Wt 146.0 lb

## 2016-10-17 DIAGNOSIS — M545 Low back pain, unspecified: Secondary | ICD-10-CM

## 2016-10-17 DIAGNOSIS — K5903 Drug induced constipation: Secondary | ICD-10-CM | POA: Diagnosis not present

## 2016-10-17 MED ORDER — MELOXICAM 7.5 MG PO TABS
7.5000 mg | ORAL_TABLET | Freq: Every day | ORAL | 1 refills | Status: AC
Start: 2016-10-17 — End: ?

## 2016-10-17 MED ORDER — CYCLOBENZAPRINE HCL 10 MG PO TABS
10.0000 mg | ORAL_TABLET | Freq: Three times a day (TID) | ORAL | 0 refills | Status: AC | PRN
Start: 2016-10-17 — End: ?

## 2016-10-17 MED ORDER — POLYETHYLENE GLYCOL 3350 17 GM/SCOOP PO POWD: 17.0000 g | Freq: Two times a day (BID) | ORAL | 1 refills | Status: AC | PRN

## 2016-10-17 NOTE — Progress Notes (Signed)
Selinda Eonnthony Gerard Marse 06/09/1961 55 y.o.   Chief Complaint  Patient presents with  . Follow-up    Back is no better, meds no help     Date of Injury: 10/14/16  History of Present Illness:  Presents for evaluation of work-related complaint. He is here today for follow-up back pain.  Lumbar strain 10/14/16 - work injury.  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 He felt immediate pain that "took my breath" at his right low back. His pain is midline and travels right toward his hip. Pain is the worst when he stands from a seated position. He has to take a few moments when he is almost at full extension to give his low back a chance to relax.   Currently taking Percocet and Norflex. C/o constipation. He was here 10/14/16 prescribed Colace 100 mg capsule. Work note was given for a few days off. He is afraid to go back to work in three days. There are no light duty jobs for him to do and he is nervous he will re-injure his back.  Denies numbness/tingling in legs, muscle weakness, saddle paresthesia, loss of bowel or bladder function.    Review of Systems  Constitutional: Negative for chills, fever and malaise/fatigue.  Respiratory: Negative for cough.   Cardiovascular: Negative for chest pain, palpitations and claudication.  Musculoskeletal: Positive for back pain.  Neurological: Negative for sensory change, focal weakness and weakness.    No Known Allergies   Current medications reviewed and updated. Past medical history, family history, social history have been reviewed and updated.   Physical Exam  Constitutional: He is oriented to person, place, and time and well-developed, well-nourished, and in no distress. No distress.  Cardiovascular: Normal rate, regular rhythm and normal heart sounds.   Pulmonary/Chest: Effort normal. No respiratory distress.  Musculoskeletal:       Lumbar  back: He exhibits decreased range of motion, tenderness, bony tenderness and pain. He exhibits no swelling, no deformity and no spasm.  Decreased ROM with hip extension.  Has to bend at the knees while bending over to touch toes. Sensation intact.TTP midline and right low back. Negative SLR. Minor pain elicited with leg flexion, adduction, abduction and cross-body movement.   Neurological: He is alert and oriented to person, place, and time. GCS score is 15.  Skin: Skin is warm and dry.  Psychiatric: Mood, memory, affect and judgment normal.  Vitals reviewed.  Dg Lumbar Spine Complete  Result Date: 10/17/2016 CLINICAL DATA:  Back pain for 1 week.  Twisting injury. EXAM: LUMBAR SPINE - COMPLETE 4+ VIEW COMPARISON:  None. FINDINGS: Five lumbar type vertebral bodies. The alignment is normal. The disc spaces are preserved. No evidence of acute fracture or pars defect. Mild facet hypertrophy and mild aortoiliac atherosclerosis noted. IMPRESSION: No evidence of acute lumbar spine injury. Electronically Signed   By: Carey BullocksWilliam  Veazey M.D.   On: 10/17/2016 16:36    Assessment and Plan: 1. Acute right-sided low back pain without sciatica 2. Drug-induced constipation - meloxicam (MOBIC) 7.5 MG tablet; Take 1 tablet (7.5 mg total) by mouth daily. Max dose 15mg /day  Dispense: 30 tablet; Refill: 1 - cyclobenzaprine (FLEXERIL) 10 MG tablet; Take 1 tablet (10 mg total) by mouth 3 (three) times daily as needed for muscle spasms.  Dispense: 30 tablet; Refill: 0 - polyethylene glycol powder (GLYCOLAX/MIRALAX) powder; Take 17 g by mouth 2 (  two) times daily as needed.  Dispense: 578 g; Refill: 1 - OK to refill pain medication at this time. Demonstrated and discussed back stretches with patient. Encouraged movement, walking, stretches. Stay hydrated, apply heat. Printed out instructions for Colace + Miralax constipation relief. RTC in three days to assess improvement. Consider referral to physical therapy at that time  if no improvement.   Marco CollieWhitney Tasheka Houseman, PA-C  [facility] 10/17/2016 4:09 PM

## 2016-10-17 NOTE — Telephone Encounter (Signed)
Patient decided to come in for follow up OV about his back pain.

## 2016-10-17 NOTE — Patient Instructions (Addendum)
Please see below for low back stretches. Perform exercises below at 5 sets with 10 repetitions. Stretches are to be performed for 5 sets, 10 seconds each. Recommend rehab twice daily within pain tolerance for 2 weeks.  If your pain is not improving in one week return to clinic and we can refer you to physical therapy at that time.    For constipation   Make sure you are drinking enough water daily. Make sure you are getting enough fiber in your diet - this will make you regular - you can eat high fiber foods or use metamucil as a supplement - it is really important to drink enough water when using fiber supplements.  If your stools are hard or are formed balls or you have to strain a stool softener will help - use colace 2-3 capsule a day  1) For gentle treatment of constipation Use Miralax 1-2 capfuls a day until your stools are soft and regular and then decrease the usage - you can use this daily  2) For more aggressive treatment of constipation Use 4 capfuls of Colace and 6 doses of Miralax and drink it in 2 hours - this should result in several watery stools - if it does not repeat the next day and then go to daily miralax for a week to make sure your bowels are clean and retrained to work properly  3) For the most aggressive treatment of constipation Use 14 capfuls of Miralax in 1 gallon of fluid (gatoraid or water work well or a combination of the two) and drink over 12h - it is ok to eat during this time and then use Miralax 1 capful daily for about 2 weeks to prevent the constipation from returning   Low Back Strain Rehab Ask your health care provider which exercises are safe for you. Do exercises exactly as told by your health care provider and adjust them as directed. It is normal to feel mild stretching, pulling, tightness, or discomfort as you do these exercises, but you should stop right away if you feel sudden pain or your pain gets worse. Do not begin these exercises until told  by your health care provider. Stretching and range of motion exercises These exercises warm up your muscles and joints and improve the movement and flexibility of your back. These exercises also help to relieve pain, numbness, and tingling. Exercise A: Single knee to chest 1. Lie on your back on a firm surface with both legs straight. 2. Bend one of your knees. Use your hands to move your knee up toward your chest until you feel a gentle stretch in your lower back and buttock.  Hold your leg in this position by holding onto the front of your knee.  Keep your other leg as straight as possible. 3. Hold for __________ seconds. 4. Slowly return to the starting position. 5. Repeat with your other leg. Repeat __________ times. Complete this exercise __________ times a day. Exercise B: Prone extension on elbows 1. Lie on your abdomen on a firm surface. 2. Prop yourself up on your elbows. 3. Use your arms to help lift your chest up until you feel a gentle stretch in your abdomen and your lower back.  This will place some of your body weight on your elbows. If this is uncomfortable, try stacking pillows under your chest.  Your hips should stay down, against the surface that you are lying on. Keep your hip and back muscles relaxed. 4. Hold for __________  seconds. 5. Slowly relax your upper body and return to the starting position. Repeat __________ times. Complete this exercise __________ times a day. Strengthening exercises These exercises build strength and endurance in your back. Endurance is the ability to use your muscles for a long time, even after they get tired. Exercise C: Pelvic tilt 1. Lie on your back on a firm surface. Bend your knees and keep your feet flat. 2. Tense your abdominal muscles. Tip your pelvis up toward the ceiling and flatten your lower back into the floor.  To help with this exercise, you may place a small towel under your lower back and try to push your back into  the towel. 3. Hold for __________ seconds. 4. Let your muscles relax completely before you repeat this exercise. Repeat __________ times. Complete this exercise __________ times a day. Exercise D: Alternating arm and leg raises 1. Get on your hands and knees on a firm surface. If you are on a hard floor, you may want to use padding to cushion your knees, such as an exercise mat. 2. Line up your arms and legs. Your hands should be below your shoulders, and your knees should be below your hips. 3. Lift your left leg behind you. At the same time, raise your right arm and straighten it in front of you.  Do not lift your leg higher than your hip.  Do not lift your arm higher than your shoulder.  Keep your abdominal and back muscles tight.  Keep your hips facing the ground.  Do not arch your back.  Keep your balance carefully, and do not hold your breath. 4. Hold for __________ seconds. 5. Slowly return to the starting position and repeat with your right leg and your left arm. Repeat __________ times. Complete this exercise __________times a day. Exercise J: Single leg lower with bent knees 1. Lie on your back on a firm surface. 2. Tense your abdominal muscles and lift your feet off the floor, one foot at a time, so your knees and hips are bent in an "L" shape (at about 90 degrees).  Your knees should be over your hips and your lower legs should be parallel to the floor. 3. Keeping your abdominal muscles tense and your knee bent, slowly lower one of your legs so your toe touches the ground. 4. Lift your leg back up to return to the starting position.  Do not hold your breath.  Do not let your back arch. Keep your back flat against the ground. 5. Repeat with your other leg. Repeat __________ times. Complete this exercise __________ times a day. Posture and body mechanics   Body mechanics refers to the movements and positions of your body while you do your daily activities. Posture is  part of body mechanics. Good posture and healthy body mechanics can help to relieve stress in your body's tissues and joints. Good posture means that your spine is in its natural S-curve position (your spine is neutral), your shoulders are pulled back slightly, and your head is not tipped forward. The following are general guidelines for applying improved posture and body mechanics to your everyday activities. Standing   When standing, keep your spine neutral and your feet about hip-width apart. Keep a slight bend in your knees. Your ears, shoulders, and hips should line up.  When you do a task in which you stand in one place for a long time, place one foot up on a stable object that is 2-4 inches (5-10  cm) high, such as a footstool. This helps keep your spine neutral. Sitting  When sitting, keep your spine neutral and keep your feet flat on the floor. Use a footrest, if necessary, and keep your thighs parallel to the floor. Avoid rounding your shoulders, and avoid tilting your head forward.  When working at a desk or a computer, keep your desk at a height where your hands are slightly lower than your elbows. Slide your chair under your desk so you are close enough to maintain good posture.  When working at a computer, place your monitor at a height where you are looking straight ahead and you do not have to tilt your head forward or downward to look at the screen. Resting   When lying down and resting, avoid positions that are most painful for you.  If you have pain with activities such as sitting, bending, stooping, or squatting (flexion-based activities), lie in a position in which your body does not bend very much. For example, avoid curling up on your side with your arms and knees near your chest (fetal position).  If you have pain with activities such as standing for a long time or reaching with your arms (extension-based activities), lie with your spine in a neutral position and bend your  knees slightly. Try the following positions:  Lying on your side with a pillow between your knees.  Lying on your back with a pillow under your knees. Lifting   When lifting objects, keep your feet at least shoulder-width apart and tighten your abdominal muscles.  Bend your knees and hips and keep your spine neutral. It is important to lift using the strength of your legs, not your back. Do not lock your knees straight out.  Always ask for help to lift heavy or awkward objects. This information is not intended to replace advice given to you by your health care provider. Make sure you discuss any questions you have with your health care provider. Document Released: 10/20/2005 Document Revised: 06/26/2016 Document Reviewed: 08/01/2015 Elsevier Interactive Patient Education  2017 [employer]   IF you received an x-ray today, you will receive an invoice from [facility]. Please contact [facility] at [phone] with questions or concerns regarding your invoice.   IF you received labwork today, you will receive an invoice from [facility]. Please contact [facility] at [phone] with questions or concerns regarding your invoice.   Our billing staff will not be able to assist you with questions regarding bills from these companies.  You will be contacted with the lab results as soon as they are available. The fastest way to get your results is to activate your My Chart account. Instructions are located on the last page of this paperwork. If you have not heard from us regarding the results in 2 weeks, please contact this office.

## 2016-10-17 NOTE — Progress Notes (Deleted)
   David Morrison  MRN: [id number] DOB: Dec 27, 1960  PCP: Delorse LekBURNETT,BRENT A, MD  Subjective:  Pt presents to clinic for ***  Review of Systems  Patient Active Problem List   Diagnosis Date Noted  . Acute pericarditis 10/23/2013  . Smoking 1/2 pack a day or less 10/22/2013  . Excessive drinking alcohol 10/22/2013  . Low back pain 10/22/2013    Current Outpatient Prescriptions on File Prior to Visit  Medication Sig Dispense Refill  . docusate sodium (COLACE) 100 MG capsule Take 1 capsule (100 mg total) by mouth 2 (two) times daily. 10 capsule 0  . orphenadrine (NORFLEX) 100 MG tablet Take 1 tablet (100 mg total) by mouth 2 (two) times daily. 30 tablet 0  . oxyCODONE-acetaminophen (PERCOCET) 5-325 MG tablet Take 1 tablet by mouth every 4 (four) hours as needed for moderate pain. 15 tablet 0  . [DISCONTINUED] colchicine 0.6 MG tablet Take 1 tablet (0.6 mg total) by mouth daily. 30 tablet 3   No current facility-administered medications on file prior to visit.     No Known Allergies   Objective:  BP 140/88 (BP Location: Right Arm, Patient Position: Sitting, Cuff Size: Normal)   Pulse 68   Temp 98.7 F (37.1 C) (Oral)   Resp 17   Ht 5\' 10"  (1.778 m)   Wt 146 lb (66.2 kg)   SpO2 99%   BMI 20.95 kg/m   Physical Exam  Assessment and Plan :  There are no diagnoses linked to this encounter.   Marco CollieWhitney Cleven Jansma, PA-C  [facility] 10/17/2016 3:45 PM

## 2016-10-18 ENCOUNTER — Telehealth: Payer: Self-pay | Admitting: Family Medicine

## 2016-10-18 MED ORDER — ATORVASTATIN CALCIUM 10 MG PO TABS: 10.0000 mg | ORAL_TABLET | Freq: Every day | ORAL | 3 refills | Status: DC

## 2016-10-18 NOTE — Telephone Encounter (Signed)
Discussed that pt should take lipitor for high cholesterol. Reviewed all his labs with him. Answered questions.

## 2016-10-20 ENCOUNTER — Telehealth: Payer: Self-pay

## 2016-10-20 ENCOUNTER — Ambulatory Visit (INDEPENDENT_AMBULATORY_CARE_PROVIDER_SITE_OTHER): Payer: BLUE CROSS/BLUE SHIELD | Admitting: Family Medicine

## 2016-10-20 VITALS — BP 124/78 | HR 79 | Temp 98.0°F | Resp 17 | Ht 72.0 in | Wt 143.0 lb

## 2016-10-20 DIAGNOSIS — Z1159 Encounter for screening for other viral diseases: Secondary | ICD-10-CM

## 2016-10-20 DIAGNOSIS — S39012D Strain of muscle, fascia and tendon of lower back, subsequent encounter: Secondary | ICD-10-CM | POA: Diagnosis not present

## 2016-10-20 DIAGNOSIS — M545 Low back pain, unspecified: Secondary | ICD-10-CM

## 2016-10-20 MED ORDER — LIDOCAINE 4 % EX CREA: 1.0000 "application " | TOPICAL_CREAM | CUTANEOUS | 0 refills | Status: DC | PRN

## 2016-10-20 NOTE — Progress Notes (Signed)
Selinda Eonnthony Gerard Winegar 1960-12-14 55 y.o.   Chief Complaint  Patient presents with  . Back Pain    Presents for evaluation of work-related complaint.  Date of Injury: 10/12/2016  History of Present Illness: Pt reports that his back pain is 8/10 He states that he had a bad reaction to one of the medications which caused his throat to swell up He states that initiating movement hurts the most and noted that getting  He also developed an allergic reaction to the Norflex He has been taking his pain medication He continues to have pain when he turns to the right side  He reports that his right side is the worse pain.  He is doing home exercises and notices that his relief from those are very slow.    Review of Systems  HENT: Positive for tinnitus. Negative for ear pain and hearing loss.   Eyes: Negative for blurred vision and double vision.  Cardiovascular: Negative for chest pain and palpitations.  Gastrointestinal: Positive for constipation. Negative for abdominal pain, nausea and vomiting.  Genitourinary: Negative for dysuria, flank pain, frequency, hematuria and urgency.  Skin: Negative for itching and rash.  Neurological: Negative for dizziness and tingling.    Current medications and allergies reviewed and updated. Past medical history, family history, social history have been reviewed and updated.   Physical Exam  Constitutional: He is well-developed, well-nourished, and in no distress.  HENT:  Head: Normocephalic and atraumatic.  Mouth/Throat: Oropharynx is clear and moist.  Eyes: Conjunctivae and EOM are normal.  Neck: Normal range of motion. Neck supple. No thyromegaly present.  Cardiovascular: Normal rate, regular rhythm and normal heart sounds.   No murmur heard. Pulmonary/Chest: Effort normal and breath sounds normal. No respiratory distress. He has no wheezes.  Musculoskeletal:       Back:  Neurological:  Reflex Scores:      Patellar reflexes are 2+ on the  right side and 2+ on the left side.    Assessment and Plan: Ethelene Brownsnthony was seen today for back pain.  Diagnoses and all orders for this visit:  Acute right-sided low back pain without sciatica -     Ambulatory referral to Physical Therapy  Strain of lumbar region, subsequent encounter  Advised patient to follow up with PT He should take meloxicam for antiinflammatory benefit and norco for break through pain Discussed that his improvement is not where it should be so it would be best not to return to work until he has had some treatment with PT -     lidocaine (ASPERCREME W/LIDOCAINE) 4 % cream; Apply 1 application topically as needed.

## 2016-10-20 NOTE — Patient Instructions (Signed)
     IF you received an x-ray today, you will receive an invoice from [facility]. Please contact [facility] at [phone] with questions or concerns regarding your invoice.   IF you received labwork today, you will receive an invoice from [facility]. Please contact [facility] at [phone] with questions or concerns regarding your invoice.   Our billing staff will not be able to assist you with questions regarding bills from these companies.  You will be contacted with the lab results as soon as they are available. The fastest way to get your results is to activate your My Chart account. Instructions are located on the last page of this paperwork. If you have not heard from us regarding the results in 2 weeks, please contact this office.     

## 2016-10-20 NOTE — Telephone Encounter (Addendum)
PATIENT STATES HE SAW DR. Creta LevinSTALLINGS ON WEDNESDAY FOR LOW BACK PAIN. SHE DID LAB WORK ON HIM AT THAT TIME. HE ALSO SAW HER TODAY, BUT HE DID NOT ASK HER WHAT KIND OF LAB TEST SHE DID WEDNESDAY. HE WAS OFF OF WORK TODAY AND HE WANTED TO DONATE HIS PLASMA TO MAKE SOME EXTRA MONEY. THEY TOLD HIM THEY FOUND HEPATITIS IN HIS BLOOD ABOUT 3 YEARS AGO. HE WANTS TO KNOW IF SHE TESTED HIM FOR HEPATITIS WHEN HE WAS IN THE OFFICE ON WEDNESDAY? BEST PHONE [phone] (CELL)  MBC

## 2016-10-21 ENCOUNTER — Encounter (INDEPENDENT_AMBULATORY_CARE_PROVIDER_SITE_OTHER): Payer: BLUE CROSS/BLUE SHIELD | Admitting: Family Medicine

## 2016-10-21 NOTE — Patient Instructions (Signed)
     IF you received an x-ray today, you will receive an invoice from [facility]. Please contact [facility] at [phone] with questions or concerns regarding your invoice.   IF you received labwork today, you will receive an invoice from [facility]. Please contact [facility] at [phone] with questions or concerns regarding your invoice.   Our billing staff will not be able to assist you with questions regarding bills from these companies.  You will be contacted with the lab results as soon as they are available. The fastest way to get your results is to activate your My Chart account. Instructions are located on the last page of this paperwork. If you have not heard from us regarding the results in 2 weeks, please contact this office.     

## 2016-10-21 NOTE — Telephone Encounter (Signed)
Pt presented for hep b test. Was seen yesterday and I spoke to lab and we can add test on to existing lab drawn 12/18 if appropriate. (within 7 days) Dr Creta Levinstallings please order if ok. 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

## 2016-10-21 NOTE — Progress Notes (Signed)
Erroneous encounter. Patient was not seen.

## 2016-10-21 NOTE — Telephone Encounter (Signed)
I added on the Hep B. I will route to add on lab pool.

## 2016-10-22 ENCOUNTER — Encounter: Payer: Self-pay | Admitting: Emergency Medicine

## 2016-10-22 ENCOUNTER — Ambulatory Visit: Payer: Self-pay | Admitting: Physician Assistant

## 2016-10-22 NOTE — Telephone Encounter (Signed)
Pt is wanting to go back to work on Thursday not the 26th and needs to get stallings to approve this with a letter   Best number [phone]

## 2016-10-23 LAB — HEPATITIS B SURFACE ANTIGEN: Hepatitis B Surface Ag: NEGATIVE

## 2016-10-23 LAB — SPECIMEN STATUS REPORT

## 2016-10-28 ENCOUNTER — Ambulatory Visit (INDEPENDENT_AMBULATORY_CARE_PROVIDER_SITE_OTHER): Payer: Worker's Compensation | Admitting: Family Medicine

## 2016-10-28 ENCOUNTER — Encounter: Payer: Self-pay | Admitting: Family Medicine

## 2016-10-28 VITALS — BP 150/82 | HR 79 | Temp 98.1°F | Resp 18 | Ht 72.0 in | Wt 142.0 lb

## 2016-10-28 DIAGNOSIS — M545 Low back pain, unspecified: Secondary | ICD-10-CM

## 2016-10-28 DIAGNOSIS — S39012D Strain of muscle, fascia and tendon of lower back, subsequent encounter: Secondary | ICD-10-CM | POA: Diagnosis not present

## 2016-10-28 MED ORDER — TRAMADOL HCL 50 MG PO TABS: 50.0000 mg | ORAL_TABLET | Freq: Three times a day (TID) | ORAL | 0 refills | Status: AC | PRN

## 2016-10-28 MED ORDER — ATORVASTATIN CALCIUM 10 MG PO TABS: 10.0000 mg | ORAL_TABLET | Freq: Every day | ORAL | 3 refills | Status: AC

## 2016-10-28 MED ORDER — LIDOCAINE 4 % EX CREA: 1.0000 "application " | TOPICAL_CREAM | CUTANEOUS | 0 refills | Status: AC | PRN

## 2016-10-28 NOTE — Progress Notes (Signed)
David Morrison Scavone 30-May-1961 55 y.o.   Chief Complaint  Patient presents with  . Follow-up    workers comp/back injury    Presents for evaluation of work-related complaint.  Date of Injury: 10/12/2016  History of Present Illness:  Pt is here to follow up on his back pain He reports that he has now been placed on light duty and the pain is still unchanged and is still 8/10.  He states that he has been able to sleep since he is taking the percocet.   Review of Systems  Constitutional: Negative for chills and fever.  Cardiovascular: Negative for chest pain and palpitations.  Gastrointestinal: Negative for abdominal pain, nausea and vomiting.  Musculoskeletal: Positive for back pain and myalgias. Negative for joint pain and neck pain.  Skin: Negative for itching and rash.    Current medications and allergies reviewed and updated. Past medical history, family history, social history have been reviewed and updated.   Physical Exam Constitutional: He is well-developed, well-nourished, and in no distress.  HENT:  Head: Normocephalic and atraumatic.  Mouth/Throat: Oropharynx is clear and moist.  Eyes: Conjunctivae and EOM are normal.  Neck: Normal range of motion. Neck supple. No thyromegaly present.  Cardiovascular: Normal rate, regular rhythm and normal heart sounds.   No murmur heard. Pulmonary/Chest: Effort normal and breath sounds normal. No respiratory distress. He has no wheezes.  Musculoskeletal:       Back:  Neurological:  Reflex Scores:      Patellar reflexes are 2+ on the right side and 2+ on the left side.  Assessment and Plan:  David Morrison Goette is here to follow up on a work related injury since 10/12/16  Ethelene Brownsnthony was seen today for follow-up.  Diagnoses and all orders for this visit:  Acute right-sided low back pain without sciatica Strain of lumbar region, subsequent encounter-   he was instructed to follow up with physical therapy Review of  lumbar xray with pt did not show any changes to alignment of spine or disc space His pain is over the SI joint on the right Discussed that since it is largely muscular and joint pain that physical therapy would be helpful Continue light duty at work  Other orders -     lidocaine (ASPERCREME W/LIDOCAINE) 4 % cream; Apply 1 application topically as needed. -     traMADol (ULTRAM) 50 MG tablet; Take 1 tablet (50 mg total) by mouth every 8 (eight) hours as needed for severe pain.

## 2016-10-28 NOTE — Patient Instructions (Signed)
     IF you received an x-ray today, you will receive an invoice from [facility]. Please contact [facility] at [phone] with questions or concerns regarding your invoice.   IF you received labwork today, you will receive an invoice from [facility]. Please contact [facility] at [phone] with questions or concerns regarding your invoice.   Our billing staff will not be able to assist you with questions regarding bills from these companies.  You will be contacted with the lab results as soon as they are available. The fastest way to get your results is to activate your My Chart account. Instructions are located on the last page of this paperwork. If you have not heard from us regarding the results in 2 weeks, please contact this office.     

## 2018-10-29 ENCOUNTER — Emergency Department (HOSPITAL_COMMUNITY): Payer: Self-pay

## 2018-10-29 ENCOUNTER — Encounter (HOSPITAL_COMMUNITY): Payer: Self-pay

## 2018-10-29 ENCOUNTER — Other Ambulatory Visit: Payer: Self-pay

## 2018-10-29 ENCOUNTER — Emergency Department (HOSPITAL_COMMUNITY)
Admission: EM | Admit: 2018-10-29 | Discharge: 2018-10-29 | Disposition: A | Discharge status: Home / Self Care | Payer: Self-pay | Attending: Emergency Medicine | Admitting: Emergency Medicine

## 2018-10-29 DIAGNOSIS — Z79899 Other long term (current) drug therapy: Secondary | ICD-10-CM | POA: Insufficient documentation

## 2018-10-29 DIAGNOSIS — W2209XA Striking against other stationary object, initial encounter: Secondary | ICD-10-CM | POA: Insufficient documentation

## 2018-10-29 DIAGNOSIS — Y9389 Activity, other specified: Secondary | ICD-10-CM | POA: Insufficient documentation

## 2018-10-29 DIAGNOSIS — S300XXA Contusion of lower back and pelvis, initial encounter: Secondary | ICD-10-CM | POA: Insufficient documentation

## 2018-10-29 DIAGNOSIS — Y9289 Other specified places as the place of occurrence of the external cause: Secondary | ICD-10-CM | POA: Insufficient documentation

## 2018-10-29 DIAGNOSIS — Y990 Civilian activity done for income or pay: Secondary | ICD-10-CM | POA: Insufficient documentation

## 2018-10-29 DIAGNOSIS — F17210 Nicotine dependence, cigarettes, uncomplicated: Secondary | ICD-10-CM | POA: Insufficient documentation

## 2018-10-29 MED ORDER — HYDROCODONE-ACETAMINOPHEN 5-325 MG PO TABS: 1.0000 | ORAL_TABLET | Freq: Four times a day (QID) | ORAL | 0 refills | Status: AC | PRN

## 2018-10-29 MED ORDER — HYDROCODONE-ACETAMINOPHEN 5-325 MG PO TABS
2.0000 | ORAL_TABLET | Freq: Once | ORAL | Status: AC
  Administered 2018-10-29: 2 via ORAL
  Filled 2018-10-29: qty 2

## 2018-10-29 MED ORDER — METHOCARBAMOL 500 MG PO TABS: 500.0000 mg | ORAL_TABLET | Freq: Three times a day (TID) | ORAL | 0 refills | Status: AC | PRN

## 2018-10-29 NOTE — ED Triage Notes (Signed)
Pt was working and a beam fell toward him pinching his back up against another machine. No other pain.  A&ox4 can walk but it hurts to walk.
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
## 2019-08-13 IMAGING — DX DG LUMBAR SPINE COMPLETE 4+V
5 series · 5 of 5 positions shown · non-contrast
Comparison: 10/17/2016

CLINICAL DATA: Crush injury against a wall with low back pain,
initial encounter

EXAM:
LUMBAR SPINE - COMPLETE 4+ VIEW

[t lumbar spine ap]
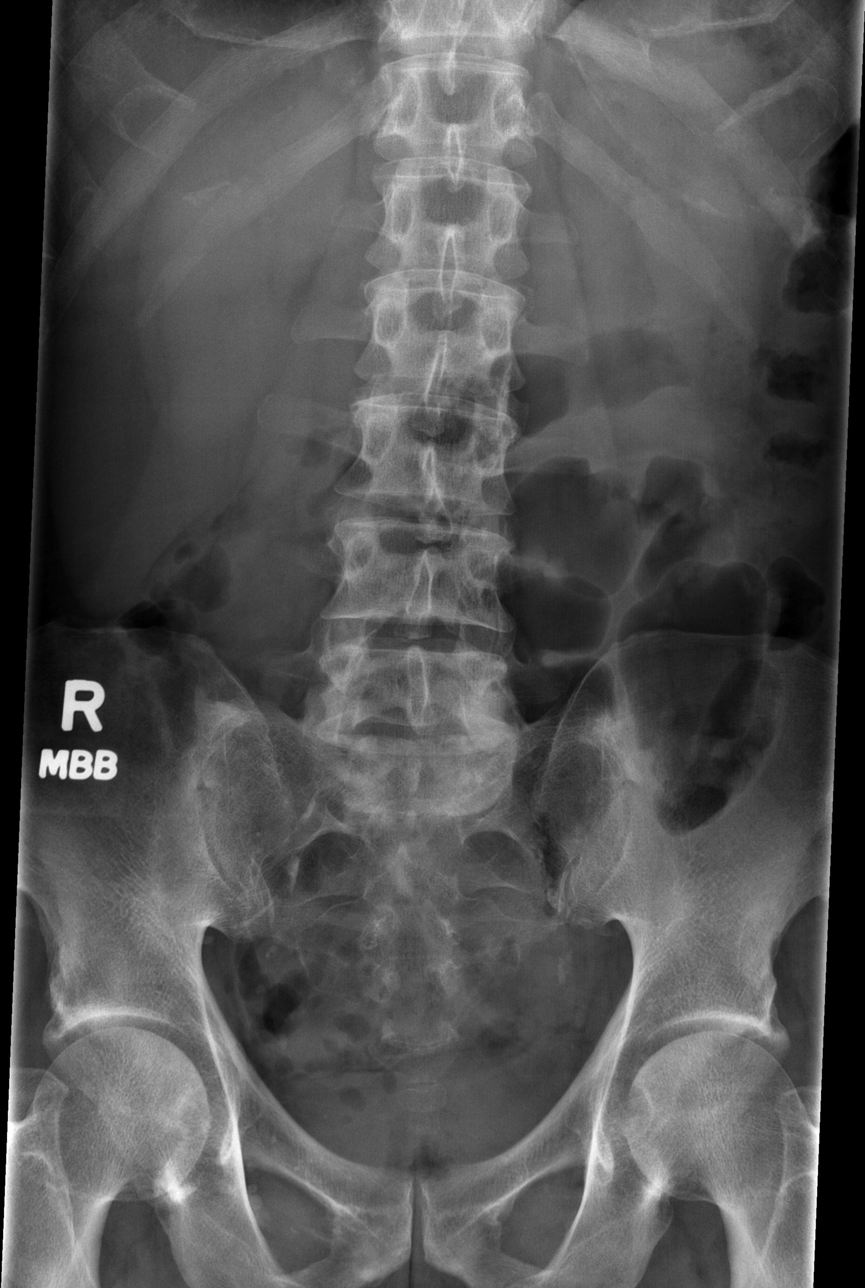

[t lumbar spine obl (1 of 2)]
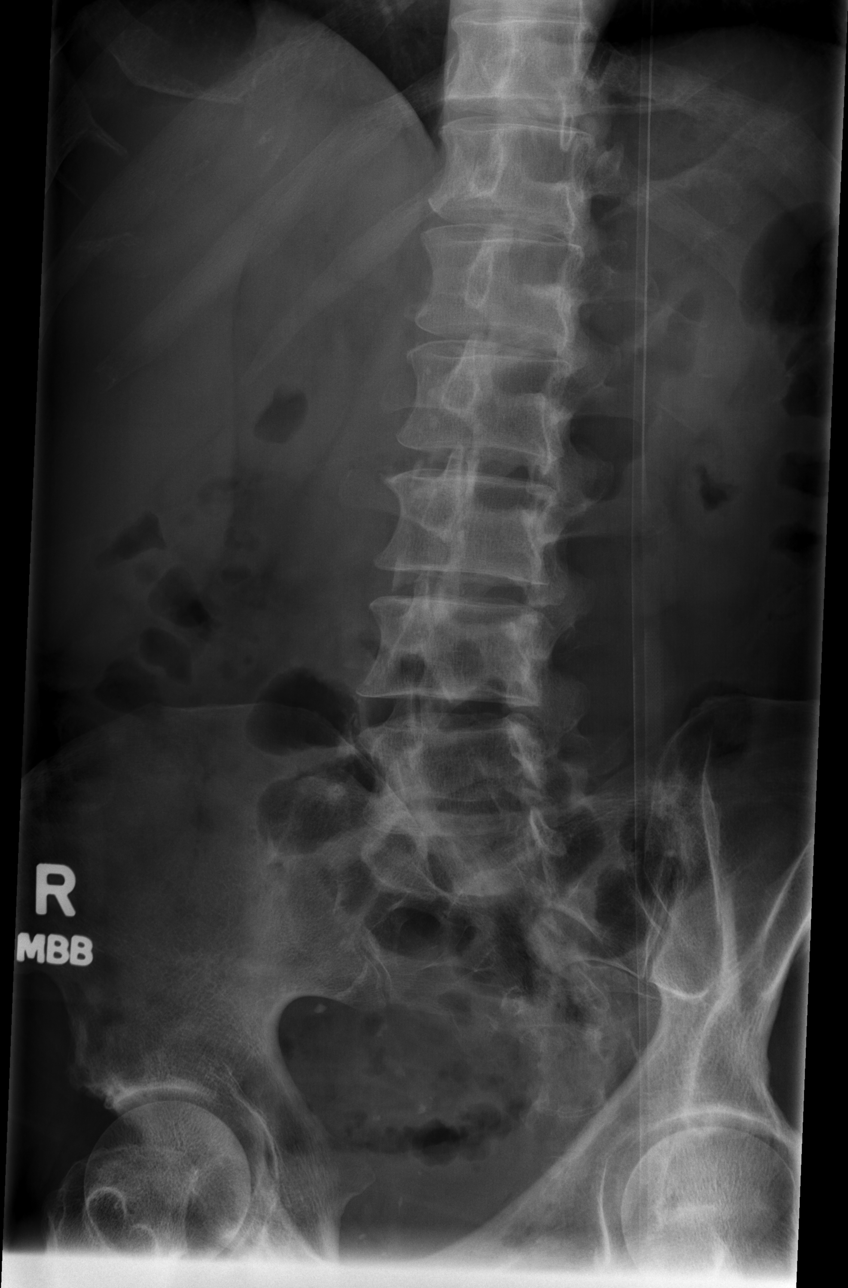

[t lumbar spine obl (2 of 2)]
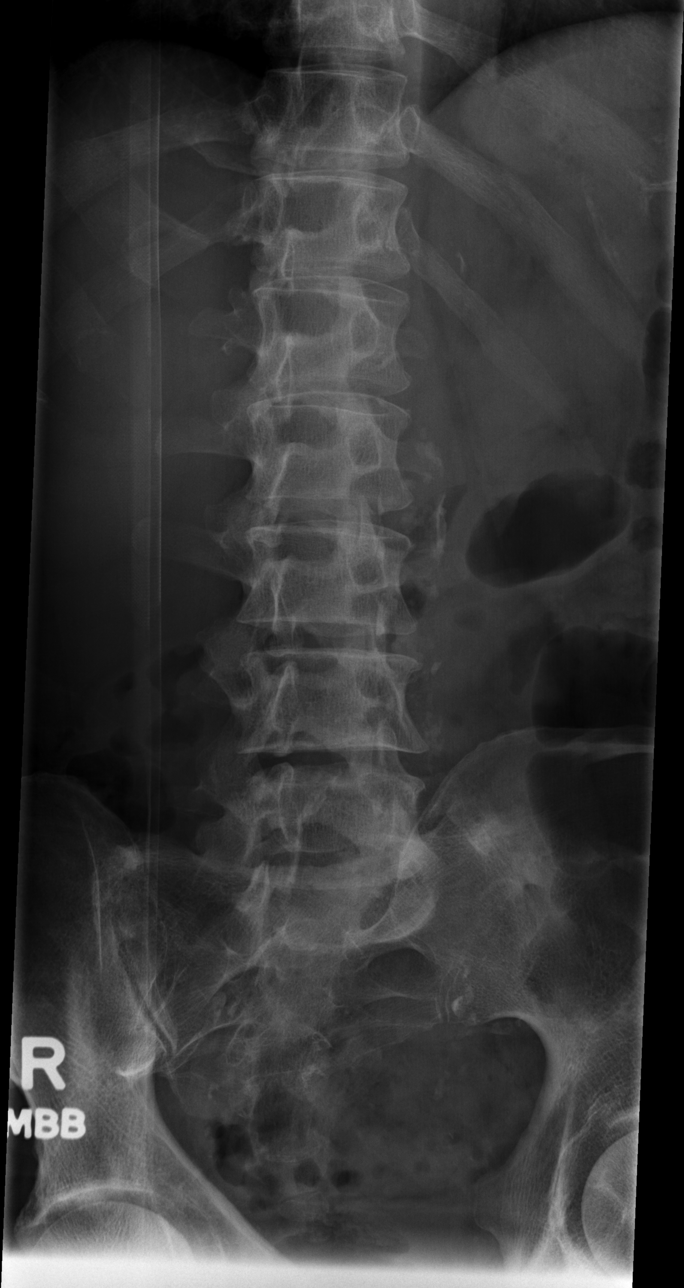

[t lumbar spine lat]
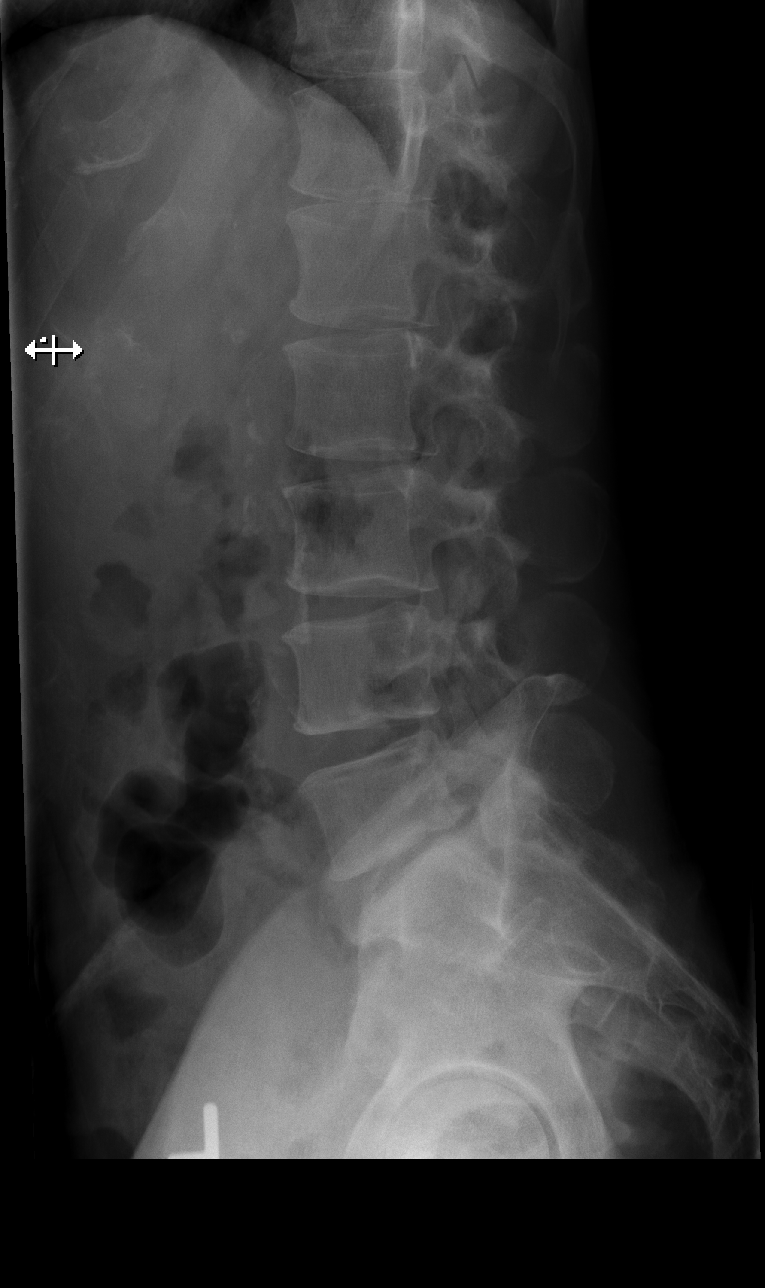

[t lumbar l-5 s-1 spot]
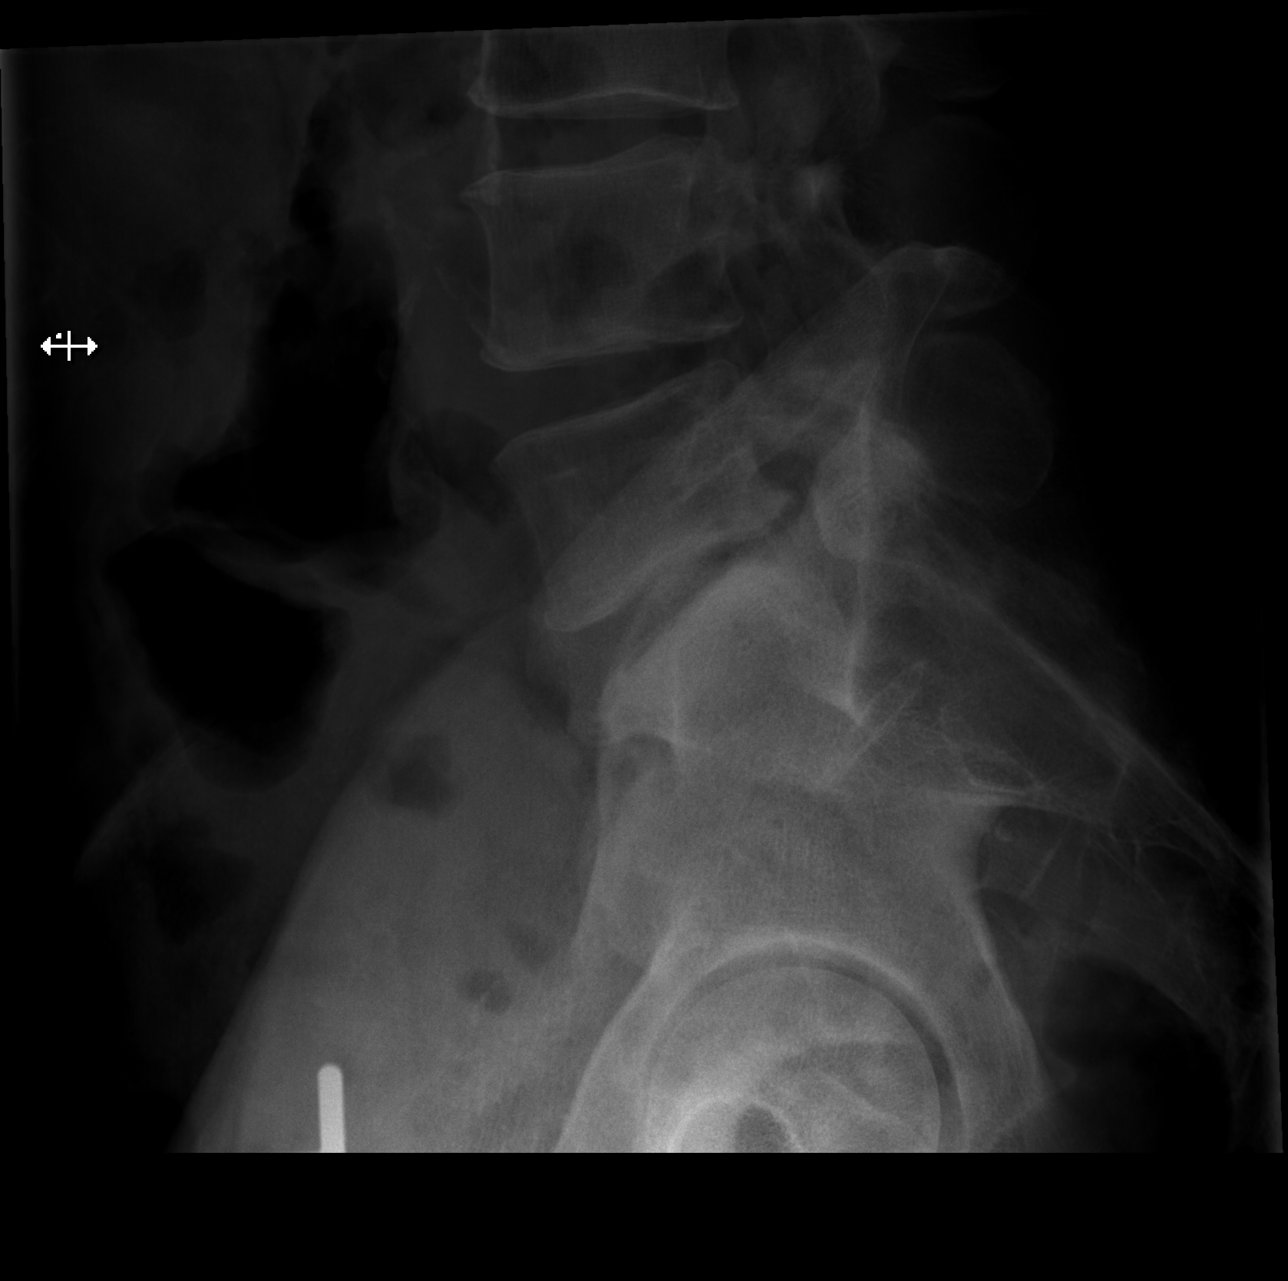

[5 of 5 positions shown; findings below may reference images not displayed]

FINDINGS: Five lumbar type vertebral bodies are well visualized. Vertebral
body height is well maintained. No pars defects are noted. Diffuse
aortic calcifications are seen without aneurysmal dilatation. Mild
disc space narrowing at L4-5 and L5-S1 is seen.
IMPRESSION: No acute abnormality noted.
